# Patient Record
Sex: Female | Born: 1975 | Hispanic: Yes | Marital: Married | Smoking: Never smoker
Health system: Southern US, Community
[De-identification: ages and names within clinical notes are randomized; demographics above are authoritative.]

## PROBLEM LIST (undated history)

## (undated) DIAGNOSIS — Q059 Spina bifida, unspecified: Secondary | ICD-10-CM

## (undated) HISTORY — DX: Spina bifida, unspecified: Q05.9

## (undated) HISTORY — PX: APPENDECTOMY: SHX54

## (undated) HISTORY — PX: CHOLECYSTECTOMY: SHX55

---

## 2018-08-02 ENCOUNTER — Encounter: Payer: Self-pay | Admitting: Obstetrics and Gynecology

## 2018-08-02 ENCOUNTER — Other Ambulatory Visit: Payer: Self-pay

## 2018-08-16 ENCOUNTER — Encounter: Payer: Self-pay | Admitting: Obstetrics and Gynecology

## 2018-11-08 ENCOUNTER — Encounter: Payer: Self-pay | Admitting: Emergency Medicine

## 2018-11-08 ENCOUNTER — Other Ambulatory Visit: Payer: Self-pay

## 2018-11-08 ENCOUNTER — Emergency Department: Payer: Medicaid Other

## 2018-11-08 ENCOUNTER — Emergency Department
Admission: EM | Admit: 2018-11-08 | Discharge: 2018-11-08 | Disposition: A | Discharge status: Home / Self Care | Payer: Medicaid Other | Attending: Emergency Medicine | Admitting: Emergency Medicine

## 2018-11-08 DIAGNOSIS — R51 Headache: Secondary | ICD-10-CM | POA: Diagnosis present

## 2018-11-08 DIAGNOSIS — G44209 Tension-type headache, unspecified, not intractable: Secondary | ICD-10-CM | POA: Insufficient documentation

## 2018-11-08 MED ORDER — METOCLOPRAMIDE HCL 5 MG/ML IJ SOLN
10.0000 mg | Freq: Once | INTRAMUSCULAR | Status: AC
  Administered 2018-11-08: 10 mg via INTRAVENOUS
  Filled 2018-11-08: qty 2

## 2018-11-08 MED ORDER — DIPHENHYDRAMINE HCL 50 MG/ML IJ SOLN
25.0000 mg | Freq: Once | INTRAMUSCULAR | Status: AC
  Administered 2018-11-08: 25 mg via INTRAVENOUS
  Filled 2018-11-08: qty 1

## 2018-11-08 MED ORDER — KETOROLAC TROMETHAMINE 30 MG/ML IJ SOLN
30.0000 mg | Freq: Once | INTRAMUSCULAR | Status: AC
  Administered 2018-11-08: 30 mg via INTRAVENOUS
  Filled 2018-11-08: qty 1

## 2018-11-08 MED ORDER — SODIUM CHLORIDE 0.9 % IV BOLUS
500.0000 mL | Freq: Once | INTRAVENOUS | Status: AC
  Administered 2018-11-08: 13:00:00 500 mL via INTRAVENOUS

## 2018-11-08 NOTE — ED Provider Notes (Signed)
[facility] Emergency Department Provider Note   ____________________________________________   First MD Initiated Contact with Patient 11/08/18 1210     (approximate)  I have reviewed the triage vital signs and the nursing notes.   HISTORY  Chief Complaint Headache Spanish interpreter present.  HPI Gabrielle Gutierrez is a 43 y.o. female presents to the ED with complaint of posterior headache radiating around to the frontal area since 8:30 PM yesterday.  Patient states that the headache started after having intercourse.  She states that she felt "dizzy and bad".  She has a history of a head injury years ago and states "they did not find any bleeding".  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  She has taken ibuprofen once for her headache without any relief.  She denies any visual changes, nausea or vomiting.  No photophobia.  She rates her pain as a 10/10.         History reviewed. No pertinent past medical history.  There are no active problems to display for this patient.   Past Surgical History:  Procedure Laterality Date  . APPENDECTOMY    . CESAREAN SECTION     2  . CHOLECYSTECTOMY      Prior to Admission medications   Not on File    Allergies Patient has no known allergies.  No family history on file.  Social History Social History   Tobacco Use  . Smoking status: Never Smoker  . Smokeless tobacco: Never Used  Substance Use Topics  . Alcohol use: Not Currently  . Drug use: Not on file    Review of Systems Constitutional: No fever/chills Eyes: No visual changes. ENT: No sore throat.  Negative for ear pain. Cardiovascular: Denies chest pain. Respiratory: Denies shortness of breath. Gastrointestinal: No abdominal pain.  No nausea, no vomiting.   Genitourinary: Negative for dysuria. Musculoskeletal: Negative for back pain. Skin: Negative for rash. Neurological: Positive for headaches, negative for  focal weakness or numbness. ____________________________________________   PHYSICAL EXAM:  VITAL SIGNS: ED Triage Vitals  Enc Vitals Group     BP 11/08/18 1157 122/79     Pulse Rate 11/08/18 1157 70     Resp 11/08/18 1157 16     Temp 11/08/18 1201 98.6 F (37 C)     Temp Source 11/08/18 1201 Oral     SpO2 11/08/18 1157 96 %     Weight 11/08/18 1041 200 lb (90.7 kg)     Height 11/08/18 1041 5\' 9"  (1.753 m)     Head Circumference --      Peak Flow --      Pain Score 11/08/18 1036 10     Pain Loc --      Pain Edu? --      Excl. in [place]? --    Constitutional: Alert and oriented. Well appearing and in no acute distress. Eyes: Conjunctivae are normal. PERRL. EOMI. Head: Atraumatic. Nose: No congestion/rhinnorhea. Mouth/Throat: Mucous membranes are moist.  Oropharynx non-erythematous. Neck: No stridor.   Cardiovascular: Normal rate, regular rhythm. Grossly normal heart sounds.  Good peripheral circulation. Respiratory: Normal respiratory effort.  No retractions. Lungs CTAB. Musculoskeletal: Moves upper and lower extremities with any difficulty.  Good muscle strength bilaterally. Neurologic:  Normal speech and language. No gross focal neurologic deficits are appreciated.  Cranial nerves II through XII grossly intact.  No gait instability. Skin:  Skin is warm, dry and intact. No rash noted. Psychiatric: Mood and affect are  normal. Speech and behavior are normal.  ____________________________________________   LABS (all labs ordered are listed, but only abnormal results are displayed)  Labs Reviewed - No data to display  RADIOLOGY  Official radiology report(s): Ct Head Wo Contrast  Result Date: 11/08/2018 CLINICAL DATA:  Severe headache since last night with dizziness. Head injury 9 years ago. EXAM: CT HEAD WITHOUT CONTRAST TECHNIQUE: Contiguous axial images were obtained from the base of the skull through the vertex without intravenous contrast. COMPARISON:  None. FINDINGS:  Brain: Normal appearing cerebral hemispheres and posterior fossa structures. Normal size and position of the ventricles. No intracranial hemorrhage, mass lesion or CT evidence of acute infarction. Vascular: No hyperdense vessel or unexpected calcification. Skull: Normal. Negative for fracture or focal lesion. Sinuses/Orbits: Unremarkable. Other: None. IMPRESSION: Normal examination. Electronically Signed   By: Claudie Revering M.D.   On: 11/08/2018 11:26    ____________________________________________   PROCEDURES  Procedure(s) performed (including Critical Care):  Procedures   ____________________________________________   INITIAL IMPRESSION / ASSESSMENT AND PLAN / ED COURSE  As part of my medical decision making, I reviewed the following data within the electronic MEDICAL RECORD NUMBER Notes from prior ED visits and [place] Controlled Substance Database    ----------------------------------------- 1:17 PM on 11/08/2018 ----------------------------------------- Headache is greatly improved.  43 year old female presents to the ED with complaint of headache that started yesterday after having intercourse.  Patient experienced some dizziness and feeling bad.  Patient has taken ibuprofen once without any relief.  She has a history of a minor head injury that was many years ago.  Physical exam was consistent with a muscle contraction headache with tenderness in the cervical muscles and scalp.  CT scan was negative and she was reassured.  After normal saline, Reglan, Benadryl and Toradol IV patient had no further headache and was discharged.  She is instructed to return to the ED if any worsening of her symptoms and to follow-up with her primary care.    ____________________________________________   FINAL CLINICAL IMPRESSION(S) / ED DIAGNOSES  Final diagnoses:  Acute non intractable tension-type headache     ED Discharge Orders    None       Note:  This document was prepared using Dragon  voice recognition software and may include unintentional dictation errors.    Johnn Hai, PA-C 11/08/18 1447    Harvest Dark, MD 11/09/18 0730

## 2018-11-08 NOTE — ED Triage Notes (Addendum)
Pain in head--from back of neck and over top since 830 pm yesterday while having intercourse.  saays dizziness and feels bad.  Says she has history of head injury, but they didn ot find any bleeding in brain at the time.  She has had some memory loss problems since then, but has not followed up.
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
## 2018-11-08 NOTE — ED Notes (Addendum)
Per interpreter, Pt reports headache from back of neck that radiates up head and to front of head. Reports started last night during intercourse. Reports some dizziness last night, no symptoms today besides the pain.

## 2018-11-08 NOTE — Discharge Instructions (Signed)
Follow-up with your primary care provider if any continued problems.  Return to the emergency department if any worsening of your symptoms or urgent concerns.

## 2018-11-26 ENCOUNTER — Ambulatory Visit: Payer: Medicaid Other | Attending: Family Medicine

## 2018-11-26 ENCOUNTER — Other Ambulatory Visit: Payer: Self-pay

## 2018-11-26 ENCOUNTER — Encounter: Payer: Self-pay | Admitting: Physical Therapy

## 2018-11-26 VITALS — BP 106/64 | HR 70

## 2018-11-26 DIAGNOSIS — M62830 Muscle spasm of back: Secondary | ICD-10-CM | POA: Insufficient documentation

## 2018-11-26 DIAGNOSIS — G8929 Other chronic pain: Secondary | ICD-10-CM | POA: Diagnosis present

## 2018-11-26 DIAGNOSIS — M5441 Lumbago with sciatica, right side: Secondary | ICD-10-CM | POA: Diagnosis present

## 2018-11-26 DIAGNOSIS — M6281 Muscle weakness (generalized): Secondary | ICD-10-CM | POA: Diagnosis present

## 2018-11-26 DIAGNOSIS — M5442 Lumbago with sciatica, left side: Secondary | ICD-10-CM | POA: Insufficient documentation
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
## 2018-11-26 NOTE — Patient Instructions (Signed)
Access Code: [email or web address]  URL: [email or web address]  Date: 11/26/2018  Prepared by: Lieutenant Diego   Exercises Supine Lower Trunk Rotation - 10 reps - 1 sets - 1x daily - 7x weekly Supine Figure 4 Piriformis Stretch - 3 reps - 1 sets - 30 hold - 1x daily - 7x weekly Standing Lumbar Extension with Counter - 10 reps - 1 sets - 1x daily - 7x weekly

## 2018-12-03 ENCOUNTER — Ambulatory Visit: Payer: Medicaid Other | Admitting: Physical Therapy

## 2018-12-04 DIAGNOSIS — R198 Other specified symptoms and signs involving the digestive system and abdomen: Secondary | ICD-10-CM | POA: Insufficient documentation

## 2018-12-04 DIAGNOSIS — R0989 Other specified symptoms and signs involving the circulatory and respiratory systems: Secondary | ICD-10-CM | POA: Insufficient documentation

## 2018-12-04 DIAGNOSIS — R196 Halitosis: Secondary | ICD-10-CM | POA: Insufficient documentation

## 2018-12-10 ENCOUNTER — Encounter: Payer: Self-pay | Admitting: Physical Therapy

## 2018-12-10 ENCOUNTER — Other Ambulatory Visit: Payer: Self-pay

## 2018-12-10 ENCOUNTER — Ambulatory Visit: Payer: Medicaid Other | Admitting: Physical Therapy

## 2018-12-10 DIAGNOSIS — M5441 Lumbago with sciatica, right side: Secondary | ICD-10-CM

## 2018-12-10 DIAGNOSIS — M5442 Lumbago with sciatica, left side: Secondary | ICD-10-CM | POA: Diagnosis not present

## 2018-12-10 DIAGNOSIS — M62830 Muscle spasm of back: Secondary | ICD-10-CM

## 2018-12-10 DIAGNOSIS — G8929 Other chronic pain: Secondary | ICD-10-CM

## 2018-12-10 DIAGNOSIS — M6281 Muscle weakness (generalized): Secondary | ICD-10-CM
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
## 2018-12-12 ENCOUNTER — Ambulatory Visit: Payer: Medicaid Other | Admitting: Physical Therapy

## 2018-12-16 ENCOUNTER — Other Ambulatory Visit: Payer: Self-pay

## 2018-12-16 ENCOUNTER — Encounter: Payer: Self-pay | Admitting: Physical Therapy

## 2018-12-16 ENCOUNTER — Ambulatory Visit: Payer: Medicaid Other | Admitting: Physical Therapy

## 2018-12-16 DIAGNOSIS — M62830 Muscle spasm of back: Secondary | ICD-10-CM

## 2018-12-16 DIAGNOSIS — G8929 Other chronic pain: Secondary | ICD-10-CM

## 2018-12-16 DIAGNOSIS — M5442 Lumbago with sciatica, left side: Secondary | ICD-10-CM | POA: Diagnosis not present

## 2018-12-16 DIAGNOSIS — M6281 Muscle weakness (generalized): Secondary | ICD-10-CM
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
## 2018-12-17 ENCOUNTER — Telehealth: Payer: Self-pay | Admitting: Physical Therapy

## 2018-12-17 ENCOUNTER — Ambulatory Visit: Payer: Medicaid Other | Admitting: Physical Therapy

## 2018-12-17 NOTE — Telephone Encounter (Signed)
Called referring physician, [name], MD's office to let them know that patient reported a radiograph taken in [place] revealed she has Spina Bifida and I wanted to make sure Dr. [name] knew this. Spoke to office staff who said they took down the message to relay to the provider.   Everlean Alstrom. Graylon Good, PT, DPT 12/17/18, 11:05 AM

## 2018-12-19 ENCOUNTER — Encounter: Payer: Medicaid Other | Admitting: Physical Therapy

## 2018-12-24 ENCOUNTER — Ambulatory Visit: Payer: Medicaid Other | Attending: Family Medicine | Admitting: Physical Therapy

## 2018-12-24 ENCOUNTER — Encounter: Payer: Self-pay | Admitting: Physical Therapy

## 2018-12-24 ENCOUNTER — Other Ambulatory Visit: Payer: Self-pay

## 2018-12-24 DIAGNOSIS — G8929 Other chronic pain: Secondary | ICD-10-CM | POA: Insufficient documentation

## 2018-12-24 DIAGNOSIS — M5441 Lumbago with sciatica, right side: Secondary | ICD-10-CM | POA: Diagnosis present

## 2018-12-24 DIAGNOSIS — M62830 Muscle spasm of back: Secondary | ICD-10-CM | POA: Diagnosis present

## 2018-12-24 DIAGNOSIS — M5442 Lumbago with sciatica, left side: Secondary | ICD-10-CM | POA: Diagnosis present

## 2018-12-24 DIAGNOSIS — M6281 Muscle weakness (generalized): Secondary | ICD-10-CM | POA: Diagnosis present
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
## 2018-12-26 ENCOUNTER — Ambulatory Visit: Payer: Medicaid Other | Admitting: Physical Therapy

## 2018-12-26 ENCOUNTER — Encounter: Payer: Self-pay | Admitting: Physical Therapy

## 2018-12-26 ENCOUNTER — Other Ambulatory Visit: Payer: Self-pay

## 2018-12-26 DIAGNOSIS — M5442 Lumbago with sciatica, left side: Secondary | ICD-10-CM | POA: Diagnosis not present

## 2018-12-26 DIAGNOSIS — G8929 Other chronic pain: Secondary | ICD-10-CM

## 2018-12-26 DIAGNOSIS — M62830 Muscle spasm of back: Secondary | ICD-10-CM

## 2018-12-26 DIAGNOSIS — M6281 Muscle weakness (generalized): Secondary | ICD-10-CM
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
## 2018-12-31 ENCOUNTER — Ambulatory Visit: Payer: Medicaid Other | Admitting: Physical Therapy

## 2018-12-31 ENCOUNTER — Other Ambulatory Visit: Payer: Self-pay

## 2018-12-31 DIAGNOSIS — M62830 Muscle spasm of back: Secondary | ICD-10-CM

## 2018-12-31 DIAGNOSIS — M5442 Lumbago with sciatica, left side: Secondary | ICD-10-CM

## 2018-12-31 DIAGNOSIS — G8929 Other chronic pain: Secondary | ICD-10-CM

## 2018-12-31 DIAGNOSIS — M6281 Muscle weakness (generalized): Secondary | ICD-10-CM
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
## 2019-01-01 ENCOUNTER — Encounter: Payer: Self-pay | Admitting: Physical Therapy

## 2019-01-02 ENCOUNTER — Other Ambulatory Visit: Payer: Self-pay

## 2019-01-02 ENCOUNTER — Ambulatory Visit: Payer: Medicaid Other | Admitting: Physical Therapy

## 2019-01-02 ENCOUNTER — Encounter: Payer: Self-pay | Admitting: Physical Therapy

## 2019-01-02 DIAGNOSIS — M6281 Muscle weakness (generalized): Secondary | ICD-10-CM

## 2019-01-02 DIAGNOSIS — G8929 Other chronic pain: Secondary | ICD-10-CM

## 2019-01-02 DIAGNOSIS — M5442 Lumbago with sciatica, left side: Secondary | ICD-10-CM | POA: Diagnosis not present

## 2019-01-02 DIAGNOSIS — M62830 Muscle spasm of back: Secondary | ICD-10-CM
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
## 2019-01-07 ENCOUNTER — Encounter: Payer: Self-pay | Admitting: Physical Therapy

## 2019-01-07 ENCOUNTER — Ambulatory Visit: Payer: Medicaid Other | Admitting: Physical Therapy

## 2019-01-07 ENCOUNTER — Other Ambulatory Visit: Payer: Self-pay

## 2019-01-07 DIAGNOSIS — G8929 Other chronic pain: Secondary | ICD-10-CM

## 2019-01-07 DIAGNOSIS — M5441 Lumbago with sciatica, right side: Secondary | ICD-10-CM

## 2019-01-07 DIAGNOSIS — M5442 Lumbago with sciatica, left side: Secondary | ICD-10-CM | POA: Diagnosis not present

## 2019-01-07 DIAGNOSIS — M6281 Muscle weakness (generalized): Secondary | ICD-10-CM

## 2019-01-07 DIAGNOSIS — M62830 Muscle spasm of back: Secondary | ICD-10-CM
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
## 2019-01-09 ENCOUNTER — Ambulatory Visit: Payer: Medicaid Other | Admitting: Physical Therapy

## 2019-01-09 ENCOUNTER — Other Ambulatory Visit: Payer: Self-pay

## 2019-01-09 DIAGNOSIS — G8929 Other chronic pain: Secondary | ICD-10-CM

## 2019-01-09 DIAGNOSIS — M62830 Muscle spasm of back: Secondary | ICD-10-CM

## 2019-01-09 DIAGNOSIS — M5442 Lumbago with sciatica, left side: Secondary | ICD-10-CM | POA: Diagnosis not present

## 2019-01-09 DIAGNOSIS — M6281 Muscle weakness (generalized): Secondary | ICD-10-CM

## 2019-01-09 DIAGNOSIS — M5441 Lumbago with sciatica, right side: Secondary | ICD-10-CM
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
## 2019-01-10 ENCOUNTER — Encounter: Payer: Self-pay | Admitting: Physical Therapy

## 2019-01-14 ENCOUNTER — Ambulatory Visit: Payer: Medicaid Other | Admitting: Physical Therapy

## 2019-01-16 ENCOUNTER — Ambulatory Visit: Payer: Medicaid Other | Admitting: Physical Therapy

## 2019-01-16 ENCOUNTER — Telehealth: Payer: Self-pay | Admitting: Physical Therapy

## 2019-01-16 NOTE — Telephone Encounter (Signed)
Attempted to call patient after she did not show up for her appointment today or Tuesday this week. Unable to get through busy signal with two attempts.   Everlean Alstrom. Graylon Good, PT, DPT 01/16/19, 1:42 PM

## 2019-01-20 ENCOUNTER — Ambulatory Visit: Payer: Medicaid Other | Admitting: Physical Therapy

## 2019-01-20 ENCOUNTER — Encounter: Payer: Self-pay | Admitting: Physical Therapy

## 2019-01-20 ENCOUNTER — Other Ambulatory Visit: Payer: Self-pay

## 2019-01-20 DIAGNOSIS — M5442 Lumbago with sciatica, left side: Secondary | ICD-10-CM | POA: Diagnosis not present

## 2019-01-20 DIAGNOSIS — M6281 Muscle weakness (generalized): Secondary | ICD-10-CM

## 2019-01-20 DIAGNOSIS — M62830 Muscle spasm of back: Secondary | ICD-10-CM

## 2019-01-20 DIAGNOSIS — G8929 Other chronic pain: Secondary | ICD-10-CM
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
## 2019-01-22 ENCOUNTER — Telehealth: Payer: Self-pay

## 2019-01-22 NOTE — Telephone Encounter (Signed)
Coronavirus (COVID-19) Are you at risk?  Are you at risk for the Coronavirus (COVID-19)?  To be considered HIGH RISK for Coronavirus (COVID-19), you have to meet the following criteria:  . Traveled to [place], [place], [place], [place] or [place]; or in the [place] to [place], [place], [place], or [place]; and have fever, cough, and shortness of breath within the last 2 weeks of travel OR . Been in close contact with a person diagnosed with COVID-19 within the last 2 weeks and have fever, cough, and shortness of breath . IF YOU DO NOT MEET THESE CRITERIA, YOU ARE CONSIDERED LOW RISK FOR COVID-19.  What to do if you are HIGH RISK for COVID-19?  Marland Kitchen If you are having a medical emergency, call 911. . Seek medical care right away. Before you go to a doctor's office, urgent care or emergency department, call ahead and tell them about your recent travel, contact with someone diagnosed with COVID-19, and your symptoms. You should receive instructions from your physician's office regarding next steps of care.  . When you arrive at healthcare provider, tell the healthcare staff immediately you have returned from visiting [place], [place], [place], [place] or [place]; or traveled in the [place] to [place], [place], [place], or [place]; in the last two weeks or you have been in close contact with a person diagnosed with COVID-19 in the last 2 weeks.   . Tell the health care staff about your symptoms: fever, cough and shortness of breath. . After you have been seen by a medical provider, you will be either: o Tested for (COVID-19) and discharged home on quarantine except to seek medical care if symptoms worsen, and asked to  - Stay home and avoid contact with others until you get your results (4-5 days)  - Avoid travel on public transportation if possible (such as bus, train, or airplane) or o Sent to the Emergency Department by EMS for evaluation, COVID-19 testing, and possible  admission depending on your condition and test results.  What to do if you are LOW RISK for COVID-19?  Reduce your risk of any infection by using the same precautions used for avoiding the common cold or flu:  Marland Kitchen Wash your hands often with soap and warm water for at least 20 seconds.  If soap and water are not readily available, use an alcohol-based hand sanitizer with at least 60% alcohol.  . If coughing or sneezing, cover your mouth and nose by coughing or sneezing into the elbow areas of your shirt or coat, into a tissue or into your sleeve (not your hands). . Avoid shaking hands with others and consider head nods or verbal greetings only. . Avoid touching your eyes, nose, or mouth with unwashed hands.  . Avoid close contact with people who are Ausencio Vaden. . Avoid places or events with large numbers of people in one location, like concerts or sporting events. . Carefully consider travel plans you have or are making. . If you are planning any travel outside or inside the [place], visit the CDC's Travelers' Health webpage for the latest health notices. . If you have some symptoms but not all symptoms, continue to monitor at home and seek medical attention if your symptoms worsen. . If you are having a medical emergency, call 911.  01/22/19 SCREENING NEG SLS ADDITIONAL HEALTHCARE OPTIONS FOR PATIENTS  [facility] / e-Visit: [email or web address]         [facility]: [phone]   Urgent Care: [phone]                   [facility]: [phone]  

## 2019-01-23 ENCOUNTER — Other Ambulatory Visit (HOSPITAL_COMMUNITY)
Admission: RE | Admit: 2019-01-23 | Discharge: 2019-01-23 | Disposition: A | Discharge status: Home / Self Care | Payer: Medicaid Other | Source: Ambulatory Visit | Attending: Obstetrics and Gynecology | Admitting: Obstetrics and Gynecology

## 2019-01-23 ENCOUNTER — Encounter: Payer: Self-pay | Admitting: Obstetrics and Gynecology

## 2019-01-23 ENCOUNTER — Ambulatory Visit: Payer: Medicaid Other | Attending: Family Medicine | Admitting: Physical Therapy

## 2019-01-23 ENCOUNTER — Encounter: Payer: Self-pay | Admitting: Physical Therapy

## 2019-01-23 ENCOUNTER — Ambulatory Visit (INDEPENDENT_AMBULATORY_CARE_PROVIDER_SITE_OTHER): Payer: Medicaid Other | Admitting: Obstetrics and Gynecology

## 2019-01-23 ENCOUNTER — Other Ambulatory Visit: Payer: Self-pay

## 2019-01-23 VITALS — BP 107/72 | HR 78 | Ht 69.0 in | Wt 209.7 lb

## 2019-01-23 DIAGNOSIS — M62830 Muscle spasm of back: Secondary | ICD-10-CM | POA: Diagnosis present

## 2019-01-23 DIAGNOSIS — M5442 Lumbago with sciatica, left side: Secondary | ICD-10-CM | POA: Insufficient documentation

## 2019-01-23 DIAGNOSIS — Z87410 Personal history of cervical dysplasia: Secondary | ICD-10-CM

## 2019-01-23 DIAGNOSIS — Z0000 Encounter for general adult medical examination without abnormal findings: Secondary | ICD-10-CM | POA: Diagnosis not present

## 2019-01-23 DIAGNOSIS — M6281 Muscle weakness (generalized): Secondary | ICD-10-CM | POA: Diagnosis present

## 2019-01-23 DIAGNOSIS — G8929 Other chronic pain: Secondary | ICD-10-CM | POA: Diagnosis present

## 2019-01-23 DIAGNOSIS — M5441 Lumbago with sciatica, right side: Secondary | ICD-10-CM | POA: Insufficient documentation

## 2019-01-23 DIAGNOSIS — Z1239 Encounter for other screening for malignant neoplasm of breast: Secondary | ICD-10-CM

## 2019-01-23 DIAGNOSIS — Z01419 Encounter for gynecological examination (general) (routine) without abnormal findings: Secondary | ICD-10-CM

## 2019-01-23 DIAGNOSIS — N951 Menopausal and female climacteric states: Secondary | ICD-10-CM | POA: Diagnosis not present

## 2019-01-23 NOTE — Addendum Note (Signed)
Addended by: Durwin Glaze on: 01/23/2019 09:10 AM   Modules accepted: Orders
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
## 2019-01-23 NOTE — Progress Notes (Signed)
HPI:      [name] is a 43 y.o. No obstetric history on file. who LMP was No LMP recorded.  Subjective:   She presents today for her annual examination.  Patient states that she began having hot flashes and signs and symptoms of menopause approximately a year and a half ago.  Menstrual periods stopped more than a year ago.  She states that her mother went into early menopause at the age of 40. Of significant note patient has a history of abnormal Pap smear with dysplasia and in [place] had cryo.  She has not had Pap smears since that time. She would like [place] to do her blood work but is not fasting today. She is due for a mammogram.    Hx: The following portions of the patient's history were reviewed and updated as appropriate:             She  has a past medical history of Spina bifida ([place]). She does not have a problem list on file. She  has a past surgical history that includes Appendectomy; Cholecystectomy; and Cesarean section. Her family history includes Cancer in her mother; Rheum arthritis in her father. She  reports that she has never smoked. She has never used smokeless tobacco. She reports previous alcohol use. No history on file for drug. She currently has no medications in their medication list. She has No Known Allergies.       Review of Systems:  Review of Systems  Constitutional: Denied constitutional symptoms, night sweats, recent illness, fatigue, fever, insomnia and weight loss.  Eyes: Denied eye symptoms, eye pain, photophobia, vision change and visual disturbance.  Ears/Nose/Throat/Neck: Denied ear, nose, throat or neck symptoms, hearing loss, nasal discharge, sinus congestion and sore throat.  Cardiovascular: Denied cardiovascular symptoms, arrhythmia, chest pain/pressure, edema, exercise intolerance, orthopnea and palpitations.  Respiratory: Denied pulmonary symptoms, asthma, pleuritic pain, productive sputum, cough, dyspnea and wheezing.   Gastrointestinal: Denied, gastro-esophageal reflux, melena, nausea and vomiting.  Genitourinary: Denied genitourinary symptoms including symptomatic vaginal discharge, pelvic relaxation issues, and urinary complaints.  Musculoskeletal: Denied musculoskeletal symptoms, stiffness, swelling, muscle weakness and myalgia.  Dermatologic: Denied dermatology symptoms, rash and scar.  Neurologic: Denied neurology symptoms, dizziness, headache, neck pain and syncope.  Psychiatric: Denied psychiatric symptoms, anxiety and depression.  Endocrine: See HPI for additional information.   Meds:   No current outpatient medications on file prior to visit.   No current facility-administered medications on file prior to visit.     Objective:     Vitals:   01/23/19 0810  BP: 107/72  Pulse: 78              Physical examination General NAD, Conversant  HEENT Atraumatic; Op clear with mmm.  Normo-cephalic. Pupils reactive. Anicteric sclerae  Thyroid/Neck Smooth without nodularity or enlargement. Normal ROM.  Neck Supple.  Skin No rashes, lesions or ulceration. Normal palpated skin turgor. No nodularity.  Breasts: No masses or discharge.  Symmetric.  No axillary adenopathy.  Lungs: Clear to auscultation.No rales or wheezes. Normal Respiratory effort, no retractions.  Heart: NSR.  No murmurs or rubs appreciated. No periferal edema  Abdomen: Soft.  Non-tender.  No masses.  No HSM. No hernia  Extremities: Moves all appropriately.  Normal ROM for age. No lymphadenopathy.  Neuro: Oriented to PPT.  Normal mood. Normal affect.     Pelvic:   Vulva: Normal appearance.  No lesions.  Vagina: No lesions or abnormalities noted.  Support: Normal pelvic  support.  Urethra No masses tenderness or scarring.  Meatus Normal size without lesions or prolapse.  Cervix:  Obviously status post cryo.   no lesions.  Anus: Normal exam.  No lesions.  Perineum: Normal exam.  No lesions.        Bimanual   Uterus: Normal  size.  Non-tender.  Mobile.  AV.  Adnexae: No masses.  Non-tender to palpation.  Cul-de-sac: Negative for abnormality.      Assessment:    No obstetric history on file. There are no active problems to display for this patient.    1. Symptomatic menopausal or female climacteric states   2. History of cervical dysplasia   3. Well woman exam with routine gynecological exam     Patient likely in premature menopause.   Plan:            1.  Basic Screening Recommendations The basic screening recommendations for asymptomatic women were discussed with the patient during her visit.  The age-appropriate recommendations were discussed with her and the rational for the tests reviewed.  When I am informed by the patient that another primary care physician has previously obtained the age-appropriate tests and they are up-to-date, only outstanding tests are ordered and referrals given as necessary.  Abnormal results of tests will be discussed with her when all of her results are completed. Pap performed-we will base future management on this Pap. Labs ordered when patient fasting Mammogram ordered 2.  Will order [facility] and if patient in menopause will discuss importance of HRT. Orders Orders Placed This Encounter  Procedures  . Follicle stimulating hormone    No orders of the defined types were placed in this encounter.       F/U  Return for We will contact her with any abnormal test results.  Finis Bud, M.D. 01/23/2019 8:46 AM

## 2019-01-28 ENCOUNTER — Other Ambulatory Visit: Payer: Self-pay

## 2019-01-28 ENCOUNTER — Other Ambulatory Visit: Payer: Medicaid Other

## 2019-01-28 ENCOUNTER — Encounter: Payer: Self-pay | Admitting: Physical Therapy

## 2019-01-28 ENCOUNTER — Ambulatory Visit: Payer: Medicaid Other

## 2019-01-28 DIAGNOSIS — M62830 Muscle spasm of back: Secondary | ICD-10-CM

## 2019-01-28 DIAGNOSIS — G8929 Other chronic pain: Secondary | ICD-10-CM

## 2019-01-28 DIAGNOSIS — M5442 Lumbago with sciatica, left side: Secondary | ICD-10-CM | POA: Diagnosis not present

## 2019-01-28 DIAGNOSIS — M6281 Muscle weakness (generalized): Secondary | ICD-10-CM

## 2019-01-28 LAB — CYTOLOGY - PAP
Diagnosis: NEGATIVE
HPV: NOT DETECTED
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
## 2019-01-29 LAB — HEMOGLOBIN A1C
Est. average glucose Bld gHb Est-mCnc: 105 mg/dL
Hgb A1c MFr Bld: 5.3 % (ref 4.8–5.6)

## 2019-01-29 LAB — LIPID PANEL
Chol/HDL Ratio: 5 ratio — ABNORMAL HIGH (ref 0.0–4.4)
Cholesterol, Total: 236 mg/dL — ABNORMAL HIGH (ref 100–199)
HDL: 47 mg/dL (ref >39)
LDL Chol Calc (NIH): 152 mg/dL — ABNORMAL HIGH (ref 0–99)
Triglycerides: 205 mg/dL — ABNORMAL HIGH (ref 0–149)
VLDL Cholesterol Cal: 37 mg/dL (ref 5–40)

## 2019-01-29 LAB — FOLLICLE STIMULATING HORMONE: FSH: 46.9 m[IU]/mL

## 2019-01-29 LAB — TSH: TSH: 3.46 u[IU]/mL (ref 0.450–4.500)

## 2019-01-30 ENCOUNTER — Other Ambulatory Visit: Payer: Self-pay

## 2019-01-30 ENCOUNTER — Ambulatory Visit: Payer: Medicaid Other

## 2019-01-30 DIAGNOSIS — M62830 Muscle spasm of back: Secondary | ICD-10-CM

## 2019-01-30 DIAGNOSIS — M5441 Lumbago with sciatica, right side: Secondary | ICD-10-CM

## 2019-01-30 DIAGNOSIS — M6281 Muscle weakness (generalized): Secondary | ICD-10-CM

## 2019-01-30 DIAGNOSIS — M5442 Lumbago with sciatica, left side: Secondary | ICD-10-CM | POA: Diagnosis not present

## 2019-01-30 DIAGNOSIS — G8929 Other chronic pain: Secondary | ICD-10-CM
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
## 2019-02-07 ENCOUNTER — Other Ambulatory Visit: Payer: Self-pay | Admitting: Family Medicine

## 2019-02-07 ENCOUNTER — Other Ambulatory Visit (HOSPITAL_COMMUNITY): Payer: Self-pay | Admitting: Family Medicine

## 2019-02-07 DIAGNOSIS — G8929 Other chronic pain: Secondary | ICD-10-CM

## 2019-02-07 DIAGNOSIS — M545 Low back pain, unspecified: Secondary | ICD-10-CM

## 2019-02-11 ENCOUNTER — Ambulatory Visit: Payer: Medicaid Other | Admitting: Physical Therapy

## 2019-02-18 ENCOUNTER — Telehealth: Payer: Self-pay | Admitting: Physical Therapy

## 2019-02-18 ENCOUNTER — Ambulatory Visit: Payer: Medicaid Other | Admitting: Physical Therapy

## 2019-02-18 NOTE — Telephone Encounter (Addendum)
Attempted to call patient to make sure she is aware of her scheduled appointments after she no-showed to her last appt, which is the first in the current set presently authorized by her insurance company. Phone was busy and unable to complete call.  Will try again later today.   Everlean Alstrom. Graylon Good, PT, DPT 02/18/19, 9:46 AM  Second attempt unsuccessful for same reason. Updated front office staff and requested help to re-attempt contacting patient over the next few days to make sure she is aware of her scheduled appointments. Patient no-showed for 2nd time in a row today.   Everlean Alstrom. Graylon Good, PT, DPT 02/18/19, 2:17 PM

## 2019-02-20 ENCOUNTER — Ambulatory Visit
Admission: RE | Admit: 2019-02-20 | Discharge: 2019-02-20 | Disposition: A | Discharge status: Home / Self Care | Payer: Medicaid Other | Source: Ambulatory Visit | Attending: Family Medicine | Admitting: Family Medicine

## 2019-02-20 ENCOUNTER — Other Ambulatory Visit: Payer: Self-pay

## 2019-02-20 ENCOUNTER — Ambulatory Visit: Payer: Medicaid Other | Attending: Family Medicine | Admitting: Physical Therapy

## 2019-02-20 ENCOUNTER — Encounter: Payer: Self-pay | Admitting: Physical Therapy

## 2019-02-20 DIAGNOSIS — G8929 Other chronic pain: Secondary | ICD-10-CM

## 2019-02-20 DIAGNOSIS — M5442 Lumbago with sciatica, left side: Secondary | ICD-10-CM | POA: Insufficient documentation

## 2019-02-20 DIAGNOSIS — M5441 Lumbago with sciatica, right side: Secondary | ICD-10-CM | POA: Diagnosis present

## 2019-02-20 DIAGNOSIS — M62830 Muscle spasm of back: Secondary | ICD-10-CM | POA: Diagnosis present

## 2019-02-20 DIAGNOSIS — M6281 Muscle weakness (generalized): Secondary | ICD-10-CM | POA: Diagnosis present
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
## 2019-02-24 ENCOUNTER — Encounter: Payer: Self-pay | Admitting: Physical Therapy

## 2019-02-24 ENCOUNTER — Other Ambulatory Visit: Payer: Self-pay

## 2019-02-24 ENCOUNTER — Ambulatory Visit: Payer: Medicaid Other | Admitting: Physical Therapy

## 2019-02-24 DIAGNOSIS — M5442 Lumbago with sciatica, left side: Secondary | ICD-10-CM | POA: Diagnosis not present

## 2019-02-24 DIAGNOSIS — M6281 Muscle weakness (generalized): Secondary | ICD-10-CM

## 2019-02-24 DIAGNOSIS — G8929 Other chronic pain: Secondary | ICD-10-CM

## 2019-02-24 DIAGNOSIS — M62830 Muscle spasm of back: Secondary | ICD-10-CM
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
## 2019-02-26 ENCOUNTER — Ambulatory Visit (INDEPENDENT_AMBULATORY_CARE_PROVIDER_SITE_OTHER): Payer: Medicaid Other | Admitting: Obstetrics and Gynecology

## 2019-02-26 ENCOUNTER — Encounter: Payer: Self-pay | Admitting: Obstetrics and Gynecology

## 2019-02-26 ENCOUNTER — Other Ambulatory Visit: Payer: Self-pay

## 2019-02-26 VITALS — BP 120/77 | HR 81 | Ht 69.0 in | Wt 211.9 lb

## 2019-02-26 DIAGNOSIS — N951 Menopausal and female climacteric states: Secondary | ICD-10-CM

## 2019-02-26 DIAGNOSIS — Z30011 Encounter for initial prescription of contraceptive pills: Secondary | ICD-10-CM | POA: Diagnosis not present

## 2019-02-26 DIAGNOSIS — E7800 Pure hypercholesterolemia, unspecified: Secondary | ICD-10-CM

## 2019-02-26 MED ORDER — LEVONORGEST-ETH ESTRAD 91-DAY 0.15-0.03 &0.01 MG PO TABS: 1.0000 | ORAL_TABLET | Freq: Every day | ORAL | 1 refills | Status: DC

## 2019-02-26 NOTE — Progress Notes (Signed)
Patient comes in today to discuss labs.  

## 2019-02-26 NOTE — Progress Notes (Signed)
HPI:      Gabrielle Gutierrez is a 43 y.o. No obstetric history on file. who LMP was No LMP recorded.  Subjective:   She presents today to discuss her lab findings at her annual examination. She was found to have elevated cholesterol She still has not had a menstrual period in several years and complains of hot flashes. Of significant note patient's mother has a history of breast cancer    Hx: The following portions of the patient's history were reviewed and updated as appropriate:             She  has a past medical history of Spina bifida ([place]). She does not have a problem list on file. She  has a past surgical history that includes Appendectomy; Cholecystectomy; and Cesarean section. Her family history includes Cancer in her mother; Rheum arthritis in her father. She  reports that she has never smoked. She has never used smokeless tobacco. She reports previous alcohol use. No history on file for drug. She currently has no medications in their medication list. She has No Known Allergies.       Review of Systems:  Review of Systems  Constitutional: Denied constitutional symptoms, night sweats, recent illness, fatigue, fever, insomnia and weight loss.  Eyes: Denied eye symptoms, eye pain, photophobia, vision change and visual disturbance.  Ears/Nose/Throat/Neck: Denied ear, nose, throat or neck symptoms, hearing loss, nasal discharge, sinus congestion and sore throat.  Cardiovascular: Denied cardiovascular symptoms, arrhythmia, chest pain/pressure, edema, exercise intolerance, orthopnea and palpitations.  Respiratory: Denied pulmonary symptoms, asthma, pleuritic pain, productive sputum, cough, dyspnea and wheezing.  Gastrointestinal: Denied, gastro-esophageal reflux, melena, nausea and vomiting.  Genitourinary: Denied genitourinary symptoms including symptomatic vaginal discharge, pelvic relaxation issues, and urinary complaints.  Musculoskeletal: Denied musculoskeletal symptoms,  stiffness, swelling, muscle weakness and myalgia.  Dermatologic: Denied dermatology symptoms, rash and scar.  Neurologic: Denied neurology symptoms, dizziness, headache, neck pain and syncope.  Psychiatric: Denied psychiatric symptoms, anxiety and depression.  Endocrine: See HPI for additional information.   Meds:   No current outpatient medications on file prior to visit.   No current facility-administered medications on file prior to visit.     Objective:     Vitals:   02/26/19 1103  BP: 120/77  Pulse: 81                Assessment:    No obstetric history on file. There are no active problems to display for this patient.    1. Symptomatic menopausal or female climacteric states   2. Hypercholesterolemia     FSH is consistent with early menopause.   Plan:            1.  Discussed elevated cholesterol and diet.  Recommendations given handout given on low-cholesterol diet.  2.  Discussed early menopause and HRT in detail.  Risks and benefits reviewed.  All questions answered.  Will start OCPs for early menopause.  Patient would like to take them in a continuous manner. Orders No orders of the defined types were placed in this encounter.   No orders of the defined types were placed in this encounter.     F/U  Return in about 4 months (around 06/29/2019). I spent 22 minutes involved in the care of this patient of which greater than 50% was spent discussing early menopause, use of HRT, risks and benefits of HRT, elevated cholesterol, low-cholesterol diet, healthy diet and exercise.  All questions answered.  Finis Bud, M.D.  02/26/2019 11:52 AM

## 2019-02-27 ENCOUNTER — Ambulatory Visit: Payer: Medicaid Other | Admitting: Physical Therapy

## 2019-02-27 ENCOUNTER — Encounter: Payer: Self-pay | Admitting: Physical Therapy

## 2019-02-27 DIAGNOSIS — M6281 Muscle weakness (generalized): Secondary | ICD-10-CM

## 2019-02-27 DIAGNOSIS — M5442 Lumbago with sciatica, left side: Secondary | ICD-10-CM | POA: Diagnosis not present

## 2019-02-27 DIAGNOSIS — M62830 Muscle spasm of back: Secondary | ICD-10-CM

## 2019-02-27 DIAGNOSIS — G8929 Other chronic pain: Secondary | ICD-10-CM
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
## 2019-02-28 ENCOUNTER — Encounter: Payer: Self-pay | Admitting: Physical Therapy

## 2019-03-03 ENCOUNTER — Other Ambulatory Visit: Payer: Self-pay

## 2019-03-03 ENCOUNTER — Encounter: Payer: Self-pay | Admitting: Physical Therapy

## 2019-03-03 ENCOUNTER — Ambulatory Visit: Payer: Medicaid Other | Admitting: Physical Therapy

## 2019-03-03 DIAGNOSIS — M62830 Muscle spasm of back: Secondary | ICD-10-CM

## 2019-03-03 DIAGNOSIS — M6281 Muscle weakness (generalized): Secondary | ICD-10-CM

## 2019-03-03 DIAGNOSIS — G8929 Other chronic pain: Secondary | ICD-10-CM

## 2019-03-03 DIAGNOSIS — M5442 Lumbago with sciatica, left side: Secondary | ICD-10-CM | POA: Diagnosis not present
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
## 2019-03-06 ENCOUNTER — Ambulatory Visit: Payer: Medicaid Other | Admitting: Physical Therapy

## 2019-03-06 ENCOUNTER — Encounter: Payer: Self-pay | Admitting: Physical Therapy

## 2019-03-06 ENCOUNTER — Other Ambulatory Visit: Payer: Self-pay

## 2019-03-06 DIAGNOSIS — M5442 Lumbago with sciatica, left side: Secondary | ICD-10-CM | POA: Diagnosis not present

## 2019-03-06 DIAGNOSIS — G8929 Other chronic pain: Secondary | ICD-10-CM

## 2019-03-06 DIAGNOSIS — M6281 Muscle weakness (generalized): Secondary | ICD-10-CM

## 2019-03-06 DIAGNOSIS — M62830 Muscle spasm of back: Secondary | ICD-10-CM
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
## 2019-03-10 ENCOUNTER — Ambulatory Visit: Payer: Medicaid Other | Admitting: Physical Therapy

## 2019-03-13 ENCOUNTER — Other Ambulatory Visit: Payer: Self-pay

## 2019-03-13 ENCOUNTER — Encounter: Payer: Self-pay | Admitting: Physical Therapy

## 2019-03-13 ENCOUNTER — Ambulatory Visit: Payer: Medicaid Other | Admitting: Physical Therapy

## 2019-03-13 DIAGNOSIS — G8929 Other chronic pain: Secondary | ICD-10-CM

## 2019-03-13 DIAGNOSIS — M5442 Lumbago with sciatica, left side: Secondary | ICD-10-CM

## 2019-03-13 DIAGNOSIS — M62830 Muscle spasm of back: Secondary | ICD-10-CM

## 2019-03-13 DIAGNOSIS — M6281 Muscle weakness (generalized): Secondary | ICD-10-CM
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
## 2019-03-14 ENCOUNTER — Ambulatory Visit
Admission: RE | Admit: 2019-03-14 | Discharge: 2019-03-14 | Disposition: A | Discharge status: Home / Self Care | Payer: Medicaid Other | Source: Ambulatory Visit | Attending: Obstetrics and Gynecology | Admitting: Obstetrics and Gynecology

## 2019-03-14 DIAGNOSIS — Z1231 Encounter for screening mammogram for malignant neoplasm of breast: Secondary | ICD-10-CM | POA: Diagnosis not present

## 2019-03-14 DIAGNOSIS — Z1239 Encounter for other screening for malignant neoplasm of breast: Secondary | ICD-10-CM

## 2019-03-17 ENCOUNTER — Encounter: Payer: Self-pay | Admitting: Emergency Medicine

## 2019-03-17 ENCOUNTER — Emergency Department
Admission: EM | Admit: 2019-03-17 | Discharge: 2019-03-17 | Disposition: A | Discharge status: Home / Self Care | Payer: Medicaid Other | Attending: Emergency Medicine | Admitting: Emergency Medicine

## 2019-03-17 ENCOUNTER — Encounter: Payer: Medicaid Other | Admitting: Physical Therapy

## 2019-03-17 DIAGNOSIS — R519 Headache, unspecified: Secondary | ICD-10-CM | POA: Diagnosis not present

## 2019-03-17 DIAGNOSIS — Z5321 Procedure and treatment not carried out due to patient leaving prior to being seen by health care provider: Secondary | ICD-10-CM | POA: Insufficient documentation
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
## 2019-03-20 ENCOUNTER — Encounter: Payer: Medicaid Other | Admitting: Physical Therapy

## 2019-04-16 ENCOUNTER — Encounter: Payer: Medicaid Other | Admitting: Obstetrics and Gynecology

## 2019-04-16 ENCOUNTER — Other Ambulatory Visit: Payer: Self-pay

## 2019-05-01 ENCOUNTER — Encounter: Payer: Self-pay | Admitting: Obstetrics and Gynecology

## 2019-05-01 ENCOUNTER — Other Ambulatory Visit: Payer: Self-pay

## 2019-05-01 ENCOUNTER — Ambulatory Visit (INDEPENDENT_AMBULATORY_CARE_PROVIDER_SITE_OTHER): Payer: Medicaid Other | Admitting: Obstetrics and Gynecology

## 2019-05-01 VITALS — BP 105/69 | HR 76 | Ht 69.0 in | Wt 212.8 lb

## 2019-05-01 DIAGNOSIS — N951 Menopausal and female climacteric states: Secondary | ICD-10-CM

## 2019-05-01 DIAGNOSIS — Z20822 Contact with and (suspected) exposure to covid-19: Secondary | ICD-10-CM

## 2019-05-01 NOTE — Progress Notes (Signed)
HPI:      [name] is a 43 y.o. No obstetric history on file. who LMP was No LMP recorded. Patient is perimenopausal.  Subjective:   She presents today to again discuss hormone replacement for premature menopause.  She continues to have hot flashes and mood changes consistent with menopause.  She has previously been tested and found to be in early menopause. At her last visit we prescribed OCPs for hormone replacement but as soon as she began taking them her mother informed her that she should stop and not take any hormones.  (Mother has a history of breast cancer-patient thinks mother and sister have been tested for the BRCA gene and found to be negative but she is not completely sure) She is here today to make a further decision regarding use of OCPs breast cancer hormone replacement therapy and early menopause. A interpreter is present to them interpret.    Hx: The following portions of the patient's history were reviewed and updated as appropriate:             She  has a past medical history of Spina bifida ([place]). She does not have a problem list on file. She  has a past surgical history that includes Appendectomy; Cholecystectomy; and Cesarean section. Her family history includes Breast cancer in her mother; Cancer in her mother; Rheum arthritis in her father. She  reports that she has never smoked. She has never used smokeless tobacco. She reports previous alcohol use. No history on file for drug. She has a current medication list which includes the following prescription(s): levonorgestrel-ethinyl estradiol. She has No Known Allergies.       Review of Systems:  Review of Systems  Constitutional: Denied constitutional symptoms, night sweats, recent illness, fatigue, fever, insomnia and weight loss.  Eyes: Denied eye symptoms, eye pain, photophobia, vision change and visual disturbance.  Ears/Nose/Throat/Neck: Denied ear, nose, throat or neck symptoms, hearing loss,  nasal discharge, sinus congestion and sore throat.  Cardiovascular: Denied cardiovascular symptoms, arrhythmia, chest pain/pressure, edema, exercise intolerance, orthopnea and palpitations.  Respiratory: Denied pulmonary symptoms, asthma, pleuritic pain, productive sputum, cough, dyspnea and wheezing.  Gastrointestinal: Denied, gastro-esophageal reflux, melena, nausea and vomiting.  Genitourinary: Denied genitourinary symptoms including symptomatic vaginal discharge, pelvic relaxation issues, and urinary complaints.  Musculoskeletal: Denied musculoskeletal symptoms, stiffness, swelling, muscle weakness and myalgia.  Dermatologic: Denied dermatology symptoms, rash and scar.  Neurologic: Denied neurology symptoms, dizziness, headache, neck pain and syncope.  Psychiatric: Denied psychiatric symptoms, anxiety and depression.  Endocrine: Denied endocrine symptoms including hot flashes and night sweats.   Meds:   Current Outpatient Medications on File Prior to Visit  Medication Sig Dispense Refill  . Levonorgestrel-Ethinyl Estradiol (AMETHIA) 0.15-0.03 &0.01 MG tablet Take 1 tablet by mouth at bedtime. Skip the last week of each pack and begin the next week without interruption (Patient not taking: Reported on 05/01/2019) 84 tablet 1   No current facility-administered medications on file prior to visit.    Objective:     Vitals:   05/01/19 0927  BP: 105/69  Pulse: 76                Assessment:    No obstetric history on file. There are no problems to display for this patient.    1. Symptomatic menopausal or female climacteric states     Patient unsure about use of hormones and family history of breast cancer   Plan:  1.  We discussed breast cancer hormones in detail.  Specifically use of OCPs and breast cancer.  I have reassured the patient that her risk of other issues are far greater than any breast cancer risk.  We have discussed the possible family history of  breast cancer and the testing for BRCA 1/BRCA2 genes.  She believes her mother and sister have had this test and will check further.  I have offered her genetic testing and she will inform me once she has discussed this with her mother and sister. She now plans to restart OCPs. (She is taking a generic form of Seasonique and skipping the last week so that she does not have a menstrual period or hot flashes during the off week) Orders No orders of the defined types were placed in this encounter.   No orders of the defined types were placed in this encounter.     F/U  No follow-ups on file. I spent 28 minutes involved in the care of this patient of which greater than 50% was spent discussing risks and benefits of OCPs in light of family history of breast cancer, BRCA1 cA2 genes, inheritable versus noninheritable cancers, health concerns regarding premature menopause especially when patient declines hormone use.  All questions answered.  Finis Bud, M.D. 05/01/2019 10:12 AM

## 2019-05-03 LAB — NOVEL CORONAVIRUS, NAA: SARS-CoV-2, NAA: NOT DETECTED

## 2019-05-04 ENCOUNTER — Telehealth: Payer: Self-pay

## 2019-05-04 NOTE — Telephone Encounter (Signed)
Received call from patient checking Covid results.  Advised results negative.   

## 2019-05-12 ENCOUNTER — Emergency Department: Payer: Medicaid Other

## 2019-05-12 ENCOUNTER — Emergency Department
Admission: EM | Admit: 2019-05-12 | Discharge: 2019-05-12 | Disposition: A | Discharge status: Home / Self Care | Payer: Medicaid Other | Attending: Emergency Medicine | Admitting: Emergency Medicine

## 2019-05-12 ENCOUNTER — Other Ambulatory Visit: Payer: Self-pay

## 2019-05-12 DIAGNOSIS — U071 COVID-19: Secondary | ICD-10-CM | POA: Diagnosis present

## 2019-05-12 LAB — CBC
HCT: 42.6 % (ref 36.0–46.0)
Hemoglobin: 14 g/dL (ref 12.0–15.0)
MCH: 28.7 pg (ref 26.0–34.0)
MCHC: 32.9 g/dL (ref 30.0–36.0)
MCV: 87.3 fL (ref 80.0–100.0)
Platelets: 280 10*3/uL (ref 150–400)
RBC: 4.88 MIL/uL (ref 3.87–5.11)
RDW: 13.4 % (ref 11.5–15.5)
WBC: 4.8 10*3/uL (ref 4.0–10.5)
nRBC: 0 % (ref 0.0–0.2)

## 2019-05-12 LAB — BASIC METABOLIC PANEL
Anion gap: 7 (ref 5–15)
BUN: 9 mg/dL (ref 6–20)
CO2: 24 mmol/L (ref 22–32)
Calcium: 8.9 mg/dL (ref 8.9–10.3)
Chloride: 106 mmol/L (ref 98–111)
Creatinine, Ser: 0.52 mg/dL (ref 0.44–1.00)
GFR calc Af Amer: >60 mL/min (ref >60)
GFR calc non Af Amer: >60 mL/min (ref >60)
Glucose, Bld: 84 mg/dL (ref 70–99)
Potassium: 3.8 mmol/L (ref 3.5–5.1)
Sodium: 137 mmol/L (ref 135–145)

## 2019-05-12 MED ORDER — PREDNISONE 20 MG PO TABS: 40.0000 mg | ORAL_TABLET | Freq: Every day | ORAL | 0 refills | Status: DC

## 2019-05-12 NOTE — ED Provider Notes (Signed)
[facility] Emergency Department Provider Note  Time seen: 2:53 PM  I have reviewed the triage vital signs and the nursing notes.   HISTORY  Chief Complaint Shortness of Breath and COVID   HPI Gabrielle Gutierrez is a 43 y.o. female with no significant past medical history presents to the emergency department for shortness of breath and continued sickness.  According to the patient she was diagnosed with COVID-19 on the 16th, symptoms started 14 or 15th.  States she has had a mild cough she has had congestion in her throat and ears and some mild shortness of breath.  Patient states she has a pulse oximeter at home and her pulse oximeter has read between 95 and 97% at home.  States however today she stood up and went to the bathroom and look like her lips were pale when she looked herself in the mirror, she became concerned so she came to the emergency department for evaluation.  Here the patient appears well denies any symptoms at this time denies any shortness of breath.   Past Medical History:  Diagnosis Date  . Spina bifida ([place])     There are no problems to display for this patient.   Past Surgical History:  Procedure Laterality Date  . APPENDECTOMY    . CESAREAN SECTION     2  . CHOLECYSTECTOMY      Prior to Admission medications   Not on File    No Known Allergies  Family History  Problem Relation Age of Onset  . Cancer Mother   . Breast cancer Mother   . Rheum arthritis Father     Social History Social History   Tobacco Use  . Smoking status: Never Smoker  . Smokeless tobacco: Never Used  Substance Use Topics  . Alcohol use: Not Currently  . Drug use: Not on file    Review of Systems Constitutional: Intermittent low-grade fever. ENT: Positive for nasal and throat congestion. Cardiovascular: Negative for chest pain. Respiratory: Intermittent mild shortness of breath.  Occasional mild cough. Gastrointestinal: Negative for  abdominal pain, vomiting Musculoskeletal: Negative for musculoskeletal complaints Neurological: Negative for headache All other ROS negative  ____________________________________________   PHYSICAL EXAM:  VITAL SIGNS: ED Triage Vitals [05/12/19 1345]  Enc Vitals Group     BP 131/84     Pulse Rate 82     Resp 18     Temp 98.8 F (37.1 C)     Temp Source Oral     SpO2 98 %     Weight 210 lb (95.3 kg)     Height 5\' 9"  (1.753 m)     Head Circumference      Peak Flow      Pain Score 6     Pain Loc      Pain Edu?      Excl. in Colon?     Constitutional: Alert and oriented. Well appearing and in no distress. Eyes: Normal exam ENT      Head: Normocephalic and atraumatic.      Mouth/Throat: Mucous membranes are moist. Cardiovascular: Normal rate, regular rhythm.  Respiratory: Normal respiratory effort without tachypnea nor retractions. Breath sounds are clear  Gastrointestinal: Soft and nontender. No distention. Musculoskeletal: Nontender with normal range of motion in all extremities.  Neurologic:  Normal speech and language. No gross focal neurologic deficits  Skin:  Skin is warm, dry and intact.  Psychiatric: Mood and affect are normal.   ____________________________________________    EKG  EKG viewed and interpreted by myself shows a normal sinus rhythm at 85 bpm with a narrow QRS, normal axis, normal intervals, no concerning ST changes.  ____________________________________________    RADIOLOGY  IMPRESSION:  No active cardiopulmonary disease.   ____________________________________________   INITIAL IMPRESSION / ASSESSMENT AND PLAN / ED COURSE  Pertinent labs & imaging results that were available during my care of the patient were reviewed by me and considered in my medical decision making (see chart for details).   Patient presents to the emergency department with COVID-19.  Patient has had symptoms for approximately 7 days at this time.  Continues to states  mild shortness of breath at times especially with exertion.  No chest pain.  Occasional cough still has congestion.  Overall the patient appears well 98% room air saturation, reassuring vitals, reassuring lab work as well as a negative chest x-ray and normal EKG.  We will place the patient on a short course of steroids have the patient follow-up with her doctor as needed.  Discussed return precautions with the patient.  Gabrielle Gutierrez was evaluated in Emergency Department on 05/12/2019 for the symptoms described in the history of present illness. She was evaluated in the context of the global COVID-19 pandemic, which necessitated consideration that the patient might be at risk for infection with the SARS-CoV-2 virus that causes COVID-19. Institutional protocols and algorithms that pertain to the evaluation of patients at risk for COVID-19 are in a state of rapid change based on information released by regulatory bodies including the CDC and federal and state organizations. These policies and algorithms were followed during the patient's care in the ED.  ____________________________________________   FINAL CLINICAL IMPRESSION(S) / ED DIAGNOSES  COVID-19   Harvest Dark, MD 05/12/19 1501

## 2019-05-12 NOTE — ED Triage Notes (Signed)
Dx with COVID Wednesday, increased SOB over last 2 days, backache. States pulse ox at home was 96%. Pt alert and oriented X4, cooperative, RR even and unlabored, color WNL. Pt in NAD.  Interpreter used for triage.

## 2019-05-12 NOTE — ED Notes (Signed)
See triage note  Presents with SOB   States the SOB became worse of the past 2 days   Afebrile on arrival

## 2019-07-02 ENCOUNTER — Encounter: Payer: Medicaid Other | Admitting: Obstetrics and Gynecology

## 2019-08-09 ENCOUNTER — Ambulatory Visit: Payer: Medicaid Other

## 2019-08-17 ENCOUNTER — Ambulatory Visit: Payer: Medicaid Other | Attending: Internal Medicine

## 2019-08-17 DIAGNOSIS — Z23 Encounter for immunization: Secondary | ICD-10-CM

## 2019-08-17 NOTE — Progress Notes (Signed)
   Covid-19 Vaccination Clinic  Name:  Gabrielle Gutierrez    MRN: [id number] DOB: June 20, 1975  08/17/2019  [name] was observed post Covid-19 immunization for 15 minutes without incident. She was provided with Vaccine Information Sheet and instruction to access the V-Safe system.   [name] was instructed to call 911 with any severe reactions post vaccine: Marland Kitchen Difficulty breathing  . Swelling of face and throat  . A fast heartbeat  . A bad rash all over body  . Dizziness and weakness   Immunizations Administered    Name Date Dose VIS Date Route   Pfizer COVID-19 Vaccine 08/17/2019  7:01 PM 0.3 mL 05/02/2019 Intramuscular   Manufacturer: [place]   Lot: [id number]   [place]: [id number]

## 2019-08-20 ENCOUNTER — Encounter: Payer: Medicaid Other | Admitting: Obstetrics and Gynecology

## 2019-09-07 ENCOUNTER — Ambulatory Visit: Payer: Medicaid Other | Attending: Internal Medicine

## 2019-09-07 DIAGNOSIS — Z23 Encounter for immunization: Secondary | ICD-10-CM

## 2019-09-07 NOTE — Progress Notes (Signed)
   Covid-19 Vaccination Clinic  Name:  Chamia Wiesner    MRN: [id number] DOB: 02-10-1976  09/07/2019  Ms. Sheniah Demske was observed post Covid-19 immunization for 15 minutes without incident. She was provided with Vaccine Information Sheet and instruction to access the V-Safe system.   Ms. Marielle Klenke was instructed to call 911 with any severe reactions post vaccine: Marland Kitchen Difficulty breathing  . Swelling of face and throat  . A fast heartbeat  . A bad rash all over body  . Dizziness and weakness   Immunizations Administered    Name Date Dose VIS Date Route   Pfizer COVID-19 Vaccine 09/07/2019  6:05 PM 0.3 mL 05/02/2019 Intramuscular   Manufacturer: [place]   Lot: [id number]   [place]: [id number]

## 2020-02-13 ENCOUNTER — Other Ambulatory Visit: Payer: Self-pay | Admitting: Family Medicine

## 2020-02-13 DIAGNOSIS — Z1231 Encounter for screening mammogram for malignant neoplasm of breast: Secondary | ICD-10-CM

## 2020-02-16 ENCOUNTER — Encounter: Payer: Self-pay | Admitting: Emergency Medicine

## 2020-02-16 ENCOUNTER — Other Ambulatory Visit: Payer: Self-pay

## 2020-02-16 ENCOUNTER — Emergency Department: Payer: Medicaid Other

## 2020-02-16 ENCOUNTER — Emergency Department
Admission: EM | Admit: 2020-02-16 | Discharge: 2020-02-16 | Disposition: A | Discharge status: Home / Self Care | Payer: Medicaid Other | Attending: Emergency Medicine | Admitting: Emergency Medicine

## 2020-02-16 DIAGNOSIS — R109 Unspecified abdominal pain: Secondary | ICD-10-CM | POA: Insufficient documentation

## 2020-02-16 LAB — COMPREHENSIVE METABOLIC PANEL
ALT: 30 U/L (ref 0–44)
AST: 21 U/L (ref 15–41)
Albumin: 4.2 g/dL (ref 3.5–5.0)
Alkaline Phosphatase: 67 U/L (ref 38–126)
Anion gap: 8 (ref 5–15)
BUN: 12 mg/dL (ref 6–20)
CO2: 26 mmol/L (ref 22–32)
Calcium: 9.4 mg/dL (ref 8.9–10.3)
Chloride: 103 mmol/L (ref 98–111)
Creatinine, Ser: 0.5 mg/dL (ref 0.44–1.00)
GFR calc Af Amer: >60 mL/min (ref >60)
GFR calc non Af Amer: >60 mL/min (ref >60)
Glucose, Bld: 89 mg/dL (ref 70–99)
Potassium: 4 mmol/L (ref 3.5–5.1)
Sodium: 137 mmol/L (ref 135–145)
Total Bilirubin: 0.8 mg/dL (ref 0.3–1.2)
Total Protein: 7.8 g/dL (ref 6.5–8.1)

## 2020-02-16 LAB — URINALYSIS, COMPLETE (UACMP) WITH MICROSCOPIC
Bilirubin Urine: NEGATIVE
Glucose, UA: NEGATIVE mg/dL
Hgb urine dipstick: NEGATIVE
Ketones, ur: NEGATIVE mg/dL
Leukocytes,Ua: NEGATIVE
Nitrite: NEGATIVE
Protein, ur: NEGATIVE mg/dL
Specific Gravity, Urine: 1.01 (ref 1.005–1.030)
pH: 8 (ref 5.0–8.0)

## 2020-02-16 LAB — CBC
HCT: 42.1 % (ref 36.0–46.0)
Hemoglobin: 13.8 g/dL (ref 12.0–15.0)
MCH: 29.2 pg (ref 26.0–34.0)
MCHC: 32.8 g/dL (ref 30.0–36.0)
MCV: 89.2 fL (ref 80.0–100.0)
Platelets: 305 10*3/uL (ref 150–400)
RBC: 4.72 MIL/uL (ref 3.87–5.11)
RDW: 13.2 % (ref 11.5–15.5)
WBC: 5.3 10*3/uL (ref 4.0–10.5)
nRBC: 0 % (ref 0.0–0.2)

## 2020-02-16 LAB — LIPASE, BLOOD: Lipase: 26 U/L (ref 11–51)

## 2020-02-16 LAB — PREGNANCY, URINE: Preg Test, Ur: NEGATIVE

## 2020-02-16 MED ORDER — GABAPENTIN 100 MG PO CAPS: 100.0000 mg | ORAL_CAPSULE | Freq: Three times a day (TID) | ORAL | 0 refills | Status: DC | PRN

## 2020-02-16 MED ORDER — IBUPROFEN 600 MG PO TABS
600.0000 mg | ORAL_TABLET | ORAL | Status: AC
  Administered 2020-02-16: 600 mg via ORAL
  Filled 2020-02-16: qty 1

## 2020-02-16 NOTE — ED Provider Notes (Signed)
[facility] Emergency Department Provider Note   ____________________________________________   First MD Initiated Contact with Patient 02/16/20 1408     (approximate)  I have reviewed the triage vital signs and the nursing notes.   HISTORY  Chief Complaint Abdominal Pain  Spanish interpreter utilized throughout  HPI Gabrielle Gutierrez is a 44 y.o. female here for evaluation of right flank discomfort  Patient reports about 2 weeks ago started having a prickly sort of lancinating feeling in her right back right flank area.  She saw her provider about 4 5 days ago at [facility] and they have trialed ibuprofen as well as a medication she is taking in the evening.  She has not seen relief.  No nausea or vomiting.  No fevers or chills.  Denies "abdominal pain" rather reports it feels like at discomfort comes ulcer like a band from her back when he coughs or edge of her right lower ribs  Denies pregnancy.  She has been through menopause for about 2 years.  No fevers or chills.  No chest pain or trouble breathing.  Reports that she is not had this before, her doctor told her that if it continued she should probably get evaluated further at the ER  Prior had her gallbladder removed.  Prior had an appendectomy.  2 previous C-sections.     Past Medical History:  Diagnosis Date  . Spina bifida ([place])     There are no problems to display for this patient.   Past Surgical History:  Procedure Laterality Date  . APPENDECTOMY    . CESAREAN SECTION     2  . CHOLECYSTECTOMY      Prior to Admission medications   Medication Sig Start Date End Date Taking? Authorizing Provider  gabapentin (NEURONTIN) 100 MG capsule Take 1 capsule (100 mg total) by mouth 3 (three) times daily as needed (right flank pain). 02/16/20 02/15/21  Delman Kitten, MD  predniSONE (DELTASONE) 20 MG tablet Take 2 tablets (40 mg total) by mouth daily. 05/12/19   Harvest Dark, MD     Allergies Patient has no known allergies.  Family History  Problem Relation Age of Onset  . Cancer Mother   . Breast cancer Mother   . Rheum arthritis Father     Social History Social History   Tobacco Use  . Smoking status: Never Smoker  . Smokeless tobacco: Never Used  Vaping Use  . Vaping Use: Never used  Substance Use Topics  . Alcohol use: Yes  . Drug use: Never    Review of Systems Constitutional: No fever/chills Eyes: No visual changes. ENT: No sore throat. Cardiovascular: Denies chest pain. Respiratory: Denies shortness of breath. Gastrointestinal: No abdominal pain.  Reports other discomfort in her right flank reporting towards her costovertebral angle dry Below the edge of her right rib margin Genitourinary: Negative for dysuria. Musculoskeletal: Negative for back pain. Skin: Negative for rash. Neurological: Negative for headaches, areas of focal weakness or numbness.    ____________________________________________   PHYSICAL EXAM:  VITAL SIGNS: ED Triage Vitals  Enc Vitals Group     BP 02/16/20 1208 108/75     Pulse Rate 02/16/20 1208 79     Resp 02/16/20 1208 16     Temp 02/16/20 1208 99.4 F (37.4 C)     Temp Source 02/16/20 1208 Oral     SpO2 02/16/20 1208 96 %     Weight 02/16/20 1209 218 lb (98.9 kg)     Height  02/16/20 1209 5\' 9"  (1.753 m)     Head Circumference --      Peak Flow --      Pain Score 02/16/20 1209 9     Pain Loc --      Pain Edu? --      Excl. in [place]? --     Constitutional: Alert and oriented. Well appearing and in no acute distress. Eyes: Conjunctivae are normal. Head: Atraumatic. Nose: No congestion/rhinnorhea. Mouth/Throat: Mucous membranes are moist. Neck: No stridor.  Cardiovascular: Normal rate, regular rhythm. Grossly normal heart sounds.  Good peripheral circulation. Respiratory: Normal respiratory effort.  No retractions. Lungs CTAB. Gastrointestinal: Soft and nontender. No distention.  No pain or  discomfort to palpation.  She does report a little bit of discomfort along the right lower thoracic paraspinous region but nothing midline.  She reports it feels like a lancinating type of discomfort that comes out.  Told by her doctor it might be "nerve pain" Musculoskeletal: No lower extremity tenderness nor edema. Neurologic:  Normal speech and language. No gross focal neurologic deficits are appreciated.  Skin:  Skin is warm, dry and intact. No rash noted. Psychiatric: Mood and affect are normal. Speech and behavior are normal.  ____________________________________________   LABS (all labs ordered are listed, but only abnormal results are displayed)  Labs Reviewed  URINALYSIS, COMPLETE (UACMP) WITH MICROSCOPIC - Abnormal; Notable for the following components:      Result Value   Color, Urine YELLOW (*)    APPearance CLEAR (*)    Bacteria, UA RARE (*)    All other components within normal limits  LIPASE, BLOOD  COMPREHENSIVE METABOLIC PANEL  CBC  PREGNANCY, URINE  POC URINE PREG, ED   ____________________________________________  EKG   ____________________________________________  RADIOLOGY  DG Chest 2 View  Result Date: 02/16/2020 CLINICAL DATA:  Flank pain right upper quadrant and right flank. Additional history provided: History of appendectomy, cholecystectomy. EXAM: CHEST - 2 VIEW COMPARISON:  Chest radiographs 05/12/2019. FINDINGS: Heart size within normal limits. No appreciable airspace consolidation or pulmonary edema. No evidence of pleural effusion or pneumothorax. No acute bony abnormality identified. IMPRESSION: No evidence of active cardiopulmonary disease. Electronically Signed   By: Kellie Simmering DO   On: 02/16/2020 14:56   US Renal  Result Date: 02/16/2020 CLINICAL DATA:  Acute right flank pain. EXAM: RENAL / URINARY TRACT ULTRASOUND COMPLETE COMPARISON:  None. FINDINGS: Right Kidney: Renal measurements: 11.8 x 5.5 x 5.5 cm = volume: 187 mL. Echogenicity  within normal limits. No mass or hydronephrosis visualized. Left Kidney: Renal measurements: 12.7 x 6.5 x 4.9 cm = volume: 212 mL. Echogenicity within normal limits. No mass or hydronephrosis visualized. Bladder: Appears normal for degree of bladder distention. Other: None. IMPRESSION: Normal renal ultrasound. Electronically Signed   By: Marijo Conception M.D.   On: 02/16/2020 15:22   US ABDOMEN LIMITED RUQ  Result Date: 02/16/2020 CLINICAL DATA:  Right upper quadrant pain. EXAM: ULTRASOUND ABDOMEN LIMITED RIGHT UPPER QUADRANT COMPARISON:  None. FINDINGS: Gallbladder: Surgically absent Common bile duct: Diameter: 2.4 mm Liver: Increased echogenicity liver diffusely. No focal liver lesion. Portal vein is patent on color Doppler imaging with normal direction of blood flow towards the liver. Other: None. IMPRESSION: Postop cholecystectomy.  No biliary dilatation Echogenic liver consistent with fatty liver. Electronically Signed   By: Franchot Gallo M.D.   On: 02/16/2020 15:25   Reassuring imaging, ultrasound is reviewed. No evidence of acute. Fatty liver is noted, discussed with the patient  recommendation for follow-up and weight loss regarding this. Patient understanding ____________________________________________   PROCEDURES  Procedure(s) performed: None  Procedures  Critical Care performed: No  ____________________________________________   INITIAL IMPRESSION / ASSESSMENT AND PLAN / ED COURSE  Pertinent labs & imaging results that were available during my care of the patient were reviewed by me and considered in my medical decision making (see chart for details).   Right flank versus rib margin pain.  Not associated with any systemic symptoms.  Her lab work-up including CBC and chemistry are normal.  Urinalysis does not reveal evidence of acute infection and she denies urinary symptoms.  Urine pregnancy test negative.  Very reassuring exam I suspect this may be neuropathic potentially  radicular coming from the low thoracic versus upper lumbar region.  Currently taking ibuprofen reports is a lancinating sharp prickly type of discomfort.  Reassuring neurologic exam.  We will obtain right upper quadrant ultrasound and right renal ultrasound to evaluate for acute intra-abdominal pathology low pretest probability low.  ----------------------------------------- 4:14 PM on 02/16/2020 -----------------------------------------  Patient alert in no distress. Very reassuring evaluation. Will trial gabapentin as I suspect this could be radicular type of pain. Her ultrasound and examination of the abdomen chest very reassuring.  Return precautions and treatment recommendations and follow-up discussed with the patient who is agreeable with the plan.       ____________________________________________   FINAL CLINICAL IMPRESSION(S) / ED DIAGNOSES  Final diagnoses:    Right flank pain        Note:  This document was prepared using Dragon voice recognition software and may include unintentional dictation errors       Delman Kitten, MD 02/16/20 1614

## 2020-02-16 NOTE — ED Triage Notes (Signed)
Pt in via POV, reports right side abdominal pain, some nausea, denies V/D.  Pain x one week, reports worsening irritation with alcohol consumption and red meat.  Denies any urinary symptoms.  Vitals WDL, NAD noted at this time.

## 2020-02-16 NOTE — ED Notes (Signed)
See triage note   Presents with pain to right lateral abd  States pain started about 2 weeks ago  No fever or n/v/d   Also denies any urinary sxs'

## 2020-02-18 ENCOUNTER — Ambulatory Visit (INDEPENDENT_AMBULATORY_CARE_PROVIDER_SITE_OTHER): Payer: Medicaid Other | Admitting: Obstetrics and Gynecology

## 2020-02-18 ENCOUNTER — Encounter: Payer: Self-pay | Admitting: Obstetrics and Gynecology

## 2020-02-18 ENCOUNTER — Other Ambulatory Visit: Payer: Self-pay

## 2020-02-18 VITALS — BP 113/75 | HR 88 | Ht 69.0 in | Wt 216.2 lb

## 2020-02-18 DIAGNOSIS — N951 Menopausal and female climacteric states: Secondary | ICD-10-CM | POA: Diagnosis not present

## 2020-02-18 DIAGNOSIS — Z01419 Encounter for gynecological examination (general) (routine) without abnormal findings: Secondary | ICD-10-CM

## 2020-02-18 DIAGNOSIS — Z1231 Encounter for screening mammogram for malignant neoplasm of breast: Secondary | ICD-10-CM | POA: Diagnosis not present

## 2020-02-18 MED ORDER — LEVONORGEST-ETH ESTRAD 91-DAY 0.15-0.03 &0.01 MG PO TABS: 1.0000 | ORAL_TABLET | Freq: Every day | ORAL | 3 refills | Status: DC

## 2020-02-18 NOTE — Progress Notes (Signed)
HPI:      [name] is a 44 y.o. No obstetric history on file. who LMP was No LMP recorded. Patient is perimenopausal.  Subjective:   She presents today for her annual examination.  She continues to experience daily hot flashes, at least 3/day.  She has not had a menstrual period in over a year.  She did try taking OCPs last year and they helped with her hot flashes but she did not continue them.  She is interested in restarting them to control her hot flashes.  She is also interested in OCPs for bone health because she is so young with early menopause.    Hx: The following portions of the patient's history were reviewed and updated as appropriate:             She  has a past medical history of Spina bifida ([place]). She does not have a problem list on file. She  has a past surgical history that includes Appendectomy; Cholecystectomy; and Cesarean section. Her family history includes Breast cancer in her mother; Cancer in her mother; Rheum arthritis in her father. She  reports that she has never smoked. She has never used smokeless tobacco. She reports current alcohol use. She reports that she does not use drugs. She currently has no medications in their medication list. She has No Known Allergies.       Review of Systems:  Review of Systems  Constitutional: Denied constitutional symptoms, night sweats, recent illness, fatigue, fever, insomnia and weight loss.  Eyes: Denied eye symptoms, eye pain, photophobia, vision change and visual disturbance.  Ears/Nose/Throat/Neck: Denied ear, nose, throat or neck symptoms, hearing loss, nasal discharge, sinus congestion and sore throat.  Cardiovascular: Denied cardiovascular symptoms, arrhythmia, chest pain/pressure, edema, exercise intolerance, orthopnea and palpitations.  Respiratory: Denied pulmonary symptoms, asthma, pleuritic pain, productive sputum, cough, dyspnea and wheezing.  Gastrointestinal: Denied, gastro-esophageal reflux,  melena, nausea and vomiting.  Genitourinary: Denied genitourinary symptoms including symptomatic vaginal discharge, pelvic relaxation issues, and urinary complaints.  Musculoskeletal: Denied musculoskeletal symptoms, stiffness, swelling, muscle weakness and myalgia.  Dermatologic: Denied dermatology symptoms, rash and scar.  Neurologic: Denied neurology symptoms, dizziness, headache, neck pain and syncope.  Psychiatric: Denied psychiatric symptoms, anxiety and depression.  Endocrine: See HPI for additional information.   Meds:   No current outpatient medications on file prior to visit.   No current facility-administered medications on file prior to visit.    Objective:     Vitals:   02/18/20 0940  BP: 113/75  Pulse: 88              Physical examination General NAD, Conversant  HEENT Atraumatic; Op clear with mmm.  Normo-cephalic. Pupils reactive. Anicteric sclerae  Thyroid/Neck Smooth without nodularity or enlargement. Normal ROM.  Neck Supple.  Skin No rashes, lesions or ulceration. Normal palpated skin turgor. No nodularity.  Breasts: No masses or discharge.  Symmetric.  No axillary adenopathy.  Lungs: Clear to auscultation.No rales or wheezes. Normal Respiratory effort, no retractions.  Heart: NSR.  No murmurs or rubs appreciated. No periferal edema  Abdomen: Soft.  Non-tender.  No masses.  No HSM. No hernia  Extremities: Moves all appropriately.  Normal ROM for age. No lymphadenopathy.  Neuro: Oriented to PPT.  Normal mood. Normal affect.     Pelvic:   Vulva: Normal appearance.  No lesions.  Vagina: No lesions or abnormalities noted.  Support: Normal pelvic support.  Urethra No masses tenderness or scarring.  Meatus Normal size  without lesions or prolapse.  Cervix: Normal appearance.  No lesions.  Anus: Normal exam.  No lesions.  Perineum: Normal exam.  No lesions.        Bimanual   Uterus: Normal size.  Non-tender.  Mobile.  AV.  Adnexae: No masses.  Non-tender to  palpation.  Cul-de-sac: Negative for abnormality.      Assessment:    No obstetric history on file. There are no problems to display for this patient.    1. Well woman exam with routine gynecological exam   2. Encounter for screening mammogram for malignant neoplasm of breast   3. Symptomatic menopausal or female climacteric states     Patient in early menopause.  Desires OCPs.   Plan:            1.  Basic Screening Recommendations The basic screening recommendations for asymptomatic women were discussed with the patient during her visit.  The age-appropriate recommendations were discussed with her and the rational for the tests reviewed.  When I am informed by the patient that another primary care physician has previously obtained the age-appropriate tests and they are up-to-date, only outstanding tests are ordered and referrals given as necessary.  Abnormal results of tests will be discussed with her when all of her results are completed.  Routine preventative health maintenance measures emphasized: Exercise/Diet/Weight control, Tobacco Warnings, Alcohol/Substance use risks and Stress Management Mammogram ordered 2.  Restart OCPs  Patient does not want to have menstrual periods so she will skip the last week of each pack. Orders Orders Placed This Encounter  Procedures  . MM 3D SCREEN BREAST BILATERAL    No orders of the defined types were placed in this encounter.           F/U  Return in about 1 year (around 02/17/2021) for Annual Physical.  Finis Bud, M.D. 02/18/2020 10:18 AM

## 2020-03-16 ENCOUNTER — Ambulatory Visit
Admission: RE | Admit: 2020-03-16 | Discharge: 2020-03-16 | Disposition: A | Discharge status: Home / Self Care | Payer: Medicaid Other | Source: Ambulatory Visit | Attending: Family Medicine | Admitting: Family Medicine

## 2020-03-16 ENCOUNTER — Other Ambulatory Visit: Payer: Self-pay

## 2020-03-16 DIAGNOSIS — Z1231 Encounter for screening mammogram for malignant neoplasm of breast: Secondary | ICD-10-CM | POA: Diagnosis not present

## 2020-03-22 ENCOUNTER — Encounter: Payer: Self-pay | Admitting: Gastroenterology

## 2020-03-22 ENCOUNTER — Ambulatory Visit (INDEPENDENT_AMBULATORY_CARE_PROVIDER_SITE_OTHER): Payer: Medicaid Other | Admitting: Gastroenterology

## 2020-03-22 ENCOUNTER — Other Ambulatory Visit: Payer: Self-pay

## 2020-03-22 VITALS — BP 112/73 | HR 80 | Temp 98.4°F | Ht 69.0 in | Wt 215.4 lb

## 2020-03-22 DIAGNOSIS — Z1211 Encounter for screening for malignant neoplasm of colon: Secondary | ICD-10-CM | POA: Diagnosis not present

## 2020-03-22 DIAGNOSIS — R1013 Epigastric pain: Secondary | ICD-10-CM

## 2020-03-22 DIAGNOSIS — Z8371 Family history of colonic polyps: Secondary | ICD-10-CM | POA: Diagnosis not present

## 2020-03-22 MED ORDER — NA SULFATE-K SULFATE-MG SULF 17.5-3.13-1.6 GM/177ML PO SOLN: 354.0000 mL | Freq: Once | ORAL | 0 refills | Status: AC

## 2020-03-22 NOTE — Progress Notes (Signed)
[name], MD [address]  [place]  [place], [place] [address]  Main: [phone]  Fax: [phone]    Gastroenterology Consultation  Referring Provider:     [name]* Primary Care Physician:  [name], MD Primary Gastroenterologist:  Dr. [name] Reason for Consultation:     Right sided abdominal pain, nausea, loose stools        HPI:   Gabrielle Gutierrez is a 44 y.o. female referred by Dr. [name], [name], MD  for consultation & management of 1 month history of right-sided abdominal pain, dull, severe in intensity, radiating to back associated with nausea, soft, nonbloody bowel movements.  She has history of cholecystectomy as well as appendectomy.  Patient reports that she modified her diet which provides some relief of her symptoms.  She denies carbonated beverages.  She stays home, not very active.  Patient also wanted to discuss about colonoscopy given her mother with history of several colon polyps.  She reports that her mom has been undergoing colonoscopy since age of 56 every 1 to 2 years and polyps have been found every time she undergoes colonoscopy.  Recent labs from 01/2020 including CBC, CMP were unremarkable Patient does not smoke or drink alcohol  NSAIDs: None  Antiplts/Anticoagulants/Anti thrombotics: None  GI Procedures: None Mother with colon polyps  Past Medical History:  Diagnosis Date   Spina bifida [facility])     Past Surgical History:  Procedure Laterality Date   APPENDECTOMY     CESAREAN SECTION     2   CHOLECYSTECTOMY     Current Outpatient Medications:    ketoconazole (NIZORAL) 2 % cream, Apply topically., Disp: , Rfl:    Levonorgestrel-Ethinyl Estradiol (AMETHIA) 0.15-0.03 &0.01 MG tablet, Take 1 tablet by mouth at bedtime., Disp: 84 tablet, Rfl: 3   meloxicam (MOBIC) 15 MG tablet, Take 15 mg by mouth daily., Disp: , Rfl:    triamcinolone ointment (KENALOG) 0.1 %, Apply  topically., Disp: , Rfl:     Family History  Problem Relation Age of Onset   Cancer Mother    Breast cancer Mother    Rheum arthritis Father      Social History   Tobacco Use   Smoking status: Never Smoker   Smokeless tobacco: Never Used  Vaping Use   Vaping Use: Never used  Substance Use Topics   Alcohol use: Not Currently   Drug use: Never    Allergies as of 03/22/2020   (No Known Allergies)    Review of Systems:    All systems reviewed and negative except where noted in HPI.   Physical Exam:  BP 112/73 (BP Location: Left Arm, Patient Position: Sitting, Cuff Size: Normal)    Pulse 80    Temp 98.4 F (36.9 C) (Oral)    Ht 5\' 9"  (1.753 m)    Wt 215 lb 6 oz (97.7 kg)    BMI 31.81 kg/m  No LMP recorded. Patient is perimenopausal.  General:   Alert,  Well-developed, well-nourished, pleasant and cooperative in NAD Head:  Normocephalic and atraumatic. Eyes:  Sclera clear, no icterus.   Conjunctiva pink. Ears:  Normal auditory acuity. Nose:  No deformity, discharge, or lesions. Mouth:  No deformity or lesions,oropharynx pink & moist. Neck:  Supple; no masses or thyromegaly. Lungs:  Respirations even and unlabored.  Clear throughout to auscultation.   No wheezes, crackles, or rhonchi. No acute distress. Heart:  Regular rate and rhythm; no murmurs, clicks, rubs,  or gallops. Abdomen:  Normal bowel sounds. Soft, non-tender and non-distended without masses, hepatosplenomegaly or hernias noted.  No guarding or rebound tenderness.   Rectal: Not performed Msk:  Symmetrical without gross deformities. Good, equal movement & strength bilaterally. Pulses:  Normal pulses noted. Extremities:  No clubbing or edema.  No cyanosis. Neurologic:  Alert and oriented x3;  grossly normal neurologically. Skin:  Intact without significant lesions or rashes. No jaundice. Psych:  Alert and cooperative. Normal mood and affect.  Imaging Studies: Reviewed  Assessment and Plan:    Gabrielle Gutierrez is a 44 y.o. Spanish-speaking female with overweight, BMI 31.8 is seen in consultation for 1 month history of right-sided abdominal pain associated with nausea and loose stools  Recommend H. pylori breath test and treat if positive  Family history of colon polyps in first-degree relative Recommend screening colonoscopy   Follow up based on above work-up   [name], MD

## 2020-03-22 NOTE — Addendum Note (Signed)
Addended by: Ulyess Blossom L on: 03/22/2020 01:33 PM   Modules accepted: Orders

## 2020-03-23 LAB — H. PYLORI BREATH TEST: H pylori Breath Test: NEGATIVE

## 2020-03-24 ENCOUNTER — Telehealth: Payer: Self-pay

## 2020-03-24 MED ORDER — OMEPRAZOLE 20 MG PO CPDR: 20.0000 mg | DELAYED_RELEASE_CAPSULE | Freq: Two times a day (BID) | ORAL | 0 refills | Status: DC

## 2020-03-24 NOTE — Telephone Encounter (Signed)
-----   Message from [name], MD sent at 03/24/2020  9:14 AM EDT ----- H. pylori breath test came back negative which means she does not have bacterial infection in her stomach.  Recommend trial of omeprazole 20 mg 1-2 times daily before meals, over-the-counter for 2 to 4 weeks if she has ongoing symptomsRohini Vanga

## 2020-03-24 NOTE — Telephone Encounter (Signed)
Patient verbalized understanding of results. Patient will pick up the omeprazole at the pharmacy.

## 2020-04-01 ENCOUNTER — Other Ambulatory Visit: Payer: Self-pay

## 2020-04-01 ENCOUNTER — Other Ambulatory Visit
Admission: RE | Admit: 2020-04-01 | Discharge: 2020-04-01 | Disposition: A | Discharge status: Home / Self Care | Payer: Medicaid Other | Source: Ambulatory Visit | Attending: Gastroenterology | Admitting: Gastroenterology

## 2020-04-01 DIAGNOSIS — Z01812 Encounter for preprocedural laboratory examination: Secondary | ICD-10-CM | POA: Diagnosis not present

## 2020-04-01 DIAGNOSIS — Z20822 Contact with and (suspected) exposure to covid-19: Secondary | ICD-10-CM | POA: Diagnosis not present

## 2020-04-02 LAB — SARS CORONAVIRUS 2 (TAT 6-24 HRS): SARS Coronavirus 2: NEGATIVE

## 2020-04-05 ENCOUNTER — Encounter: Payer: Self-pay | Admitting: Gastroenterology

## 2020-04-05 ENCOUNTER — Ambulatory Visit
Admission: RE | Admit: 2020-04-05 | Discharge: 2020-04-05 | Disposition: A | Discharge status: Home / Self Care | Payer: Medicaid Other | Attending: Gastroenterology | Admitting: Gastroenterology

## 2020-04-05 ENCOUNTER — Ambulatory Visit: Payer: Medicaid Other | Admitting: Certified Registered Nurse Anesthetist

## 2020-04-05 ENCOUNTER — Encounter
Admission: RE | Disposition: A | Discharge status: Home / Self Care | Payer: Self-pay | Source: Home / Self Care | Attending: Gastroenterology

## 2020-04-05 DIAGNOSIS — Z793 Long term (current) use of hormonal contraceptives: Secondary | ICD-10-CM | POA: Insufficient documentation

## 2020-04-05 DIAGNOSIS — Z8371 Family history of colonic polyps: Secondary | ICD-10-CM | POA: Insufficient documentation

## 2020-04-05 DIAGNOSIS — Q059 Spina bifida, unspecified: Secondary | ICD-10-CM | POA: Diagnosis not present

## 2020-04-05 DIAGNOSIS — Z8261 Family history of arthritis: Secondary | ICD-10-CM | POA: Insufficient documentation

## 2020-04-05 DIAGNOSIS — D123 Benign neoplasm of transverse colon: Secondary | ICD-10-CM | POA: Insufficient documentation

## 2020-04-05 DIAGNOSIS — Z803 Family history of malignant neoplasm of breast: Secondary | ICD-10-CM | POA: Diagnosis not present

## 2020-04-05 DIAGNOSIS — K635 Polyp of colon: Secondary | ICD-10-CM

## 2020-04-05 DIAGNOSIS — Z791 Long term (current) use of non-steroidal anti-inflammatories (NSAID): Secondary | ICD-10-CM | POA: Diagnosis not present

## 2020-04-05 DIAGNOSIS — Z1211 Encounter for screening for malignant neoplasm of colon: Secondary | ICD-10-CM | POA: Diagnosis not present

## 2020-04-05 DIAGNOSIS — Z809 Family history of malignant neoplasm, unspecified: Secondary | ICD-10-CM | POA: Diagnosis not present

## 2020-04-05 DIAGNOSIS — Z9049 Acquired absence of other specified parts of digestive tract: Secondary | ICD-10-CM | POA: Insufficient documentation

## 2020-04-05 DIAGNOSIS — Z79899 Other long term (current) drug therapy: Secondary | ICD-10-CM | POA: Diagnosis not present

## 2020-04-05 HISTORY — PX: COLONOSCOPY WITH PROPOFOL: SHX5780

## 2020-04-05 LAB — POCT PREGNANCY, URINE: Preg Test, Ur: NEGATIVE

## 2020-04-05 SURGERY — COLONOSCOPY WITH PROPOFOL
Anesthesia: General

## 2020-04-05 MED ORDER — LIDOCAINE HCL (CARDIAC) PF 100 MG/5ML IV SOSY
PREFILLED_SYRINGE | INTRAVENOUS | Status: DC | PRN
  Administered 2020-04-05: 50 mg via INTRAVENOUS

## 2020-04-05 MED ORDER — PROPOFOL 500 MG/50ML IV EMUL
INTRAVENOUS | Status: DC | PRN
  Administered 2020-04-05: 130 ug/kg/min via INTRAVENOUS

## 2020-04-05 MED ORDER — LIDOCAINE HCL (PF) 1 % IJ SOLN
INTRAMUSCULAR | Status: AC
  Filled 2020-04-05: qty 2

## 2020-04-05 MED ORDER — PROPOFOL 500 MG/50ML IV EMUL
INTRAVENOUS | Status: AC
  Filled 2020-04-05: qty 50

## 2020-04-05 MED ORDER — PROPOFOL 10 MG/ML IV BOLUS
INTRAVENOUS | Status: DC | PRN
  Administered 2020-04-05: 50 mg via INTRAVENOUS
  Administered 2020-04-05: 30 mg via INTRAVENOUS
  Administered 2020-04-05 (×2): 50 mg via INTRAVENOUS

## 2020-04-05 MED ORDER — SODIUM CHLORIDE 0.9 % IV SOLN
INTRAVENOUS | Status: DC
  Administered 2020-04-05: 1000 mL via INTRAVENOUS

## 2020-04-05 NOTE — Anesthesia Postprocedure Evaluation (Signed)
Anesthesia Post Note  Patient: Gabrielle Gutierrez  Procedure(s) Performed: COLONOSCOPY WITH PROPOFOL (N/A )  Patient location during evaluation: PACU Anesthesia Type: General Level of consciousness: awake and alert Pain management: pain level controlled Vital Signs Assessment: post-procedure vital signs reviewed and stable Respiratory status: spontaneous breathing, nonlabored ventilation and respiratory function stable Cardiovascular status: blood pressure returned to baseline and stable Postop Assessment: no apparent nausea or vomiting Anesthetic complications: no   No complications documented.   Last Vitals:  Vitals:   04/05/20 1031 04/05/20 1041  BP: (!) 152/129 122/84  Pulse: 72 63  Resp: 14 (!) 22  Temp:    SpO2: 100% 100%    Last Pain:  Vitals:   04/05/20 1041  TempSrc:   PainSc: 0-No pain                 Brett Canales Amie Cowens

## 2020-04-05 NOTE — Op Note (Signed)
[facility] Gastroenterology Patient Name: Gabrielle Gutierrez Procedure Date: 04/05/2020 9:43 AM MRN: [id number] Account #: [id number] Date of Birth: 1975-12-24 Admit Type: Outpatient Age: 44 Room: [facility] ENDO ROOM 1 Gender: Female Note Status: Finalized Procedure:             Colonoscopy Indications:           Colon cancer screening in patient at increased risk:                         Family history of 1st-degree relative with colon                         polyps, This is the patient's first colonoscopy Providers:             [name] MD, MD Referring MD:          [name] Revelo (Referring MD) Medicines:             General Anesthesia Complications:         No immediate complications. Estimated blood loss: None. Procedure:             Pre-Anesthesia Assessment:                        - Prior to the procedure, a History and Physical was                         performed, and patient medications and allergies were                         reviewed. The patient is competent. The risks and                         benefits of the procedure and the sedation options and                         risks were discussed with the patient. All questions                         were answered and informed consent was obtained.                         Patient identification and proposed procedure were                         verified by the physician, the nurse, the                         anesthesiologist, the anesthetist and the technician                         in the pre-procedure area in the procedure room in the                         endoscopy suite. Mental Status Examination: alert and                         oriented. Airway Examination: normal oropharyngeal  airway and neck mobility. Respiratory Examination:                         clear to auscultation. CV Examination: normal.                         Prophylactic Antibiotics:  The patient does not require                         prophylactic antibiotics. Prior Anticoagulants: The                         patient has taken no previous anticoagulant or                         antiplatelet agents. ASA Grade Assessment: I - A                         normal, healthy patient. After reviewing the risks and                         benefits, the patient was deemed in satisfactory                         condition to undergo the procedure. The anesthesia                         plan was to use general anesthesia. Immediately prior                         to administration of medications, the patient was                         re-assessed for adequacy to receive sedatives. The                         heart rate, respiratory rate, oxygen saturations,                         blood pressure, adequacy of pulmonary ventilation, and                         response to care were monitored throughout the                         procedure. The physical status of the patient was                         re-assessed after the procedure.                        After obtaining informed consent, the colonoscope was                         passed under direct vision. Throughout the procedure,                         the patient's blood pressure, pulse, and oxygen  saturations were monitored continuously. The                         Colonoscope was introduced through the anus and                         advanced to the the terminal ileum, with                         identification of the appendiceal orifice and IC                         valve. The colonoscopy was performed without                         difficulty. The patient tolerated the procedure well.                         The quality of the bowel preparation was evaluated                         using the BBPS [facility] Bowel Preparation Scale) with                         scores of: Right Colon = 3,  Transverse Colon = 3 and                         Left Colon = 3 (entire mucosa seen well with no                         residual staining, small fragments of stool or opaque                         liquid). The total BBPS score equals 9. Findings:      The perianal and digital rectal examinations were normal. Pertinent       negatives include normal sphincter tone and no palpable rectal lesions.      The terminal ileum appeared normal.      A 5 mm polyp was found in the transverse colon. The polyp was sessile.       The polyp was removed with a cold snare. Resection and retrieval were       complete.      Normal mucosa was found in the entire colon. Biopsies were taken with a       cold forceps for histology.      The retroflexed view of the distal rectum and anal verge was normal and       showed no anal or rectal abnormalities. Impression:            - The examined portion of the ileum was normal.                        - One 5 mm polyp in the transverse colon, removed with                         a cold snare. Resected and retrieved.                        -  Normal mucosa in the entire examined colon. Biopsied.                        - The distal rectum and anal verge are normal on                         retroflexion view. Recommendation:        - Discharge patient to home (with escort).                        - Resume previous diet today.                        - Continue present medications.                        - Await pathology results.                        - Repeat colonoscopy in 7 years for surveillance. Procedure Code(s):     --- Professional ---                        [id number], Colonoscopy, flexible; with removal of                         tumor(s), polyp(s), or other lesion(s) by snare                         technique                        45380, 12, Colonoscopy, flexible; with biopsy, single                         or multiple Diagnosis Code(s):     --- Professional  ---                        Z83.71, Family history of colonic polyps                        K63.5, Polyp of colon CPT copyright 2019 American Medical Association. All rights reserved. The codes documented in this report are preliminary and upon coder review may  be revised to meet current compliance requirements. Dr. Ulyess Mort [name] MD, MD 04/05/2020 10:09:52 AM This report has been signed electronically. Number of Addenda: 0 Note Initiated On: 04/05/2020 9:43 AM Scope Withdrawal Time: 0 hours 14 minutes 57 seconds  Total Procedure Duration: 0 hours 18 minutes 31 seconds  Estimated Blood Loss:  Estimated blood loss: none.      [facility]

## 2020-04-05 NOTE — H&P (Signed)
[name], MD [address]  [place]  [place], [place] [address]  Main: [phone]  Fax: [phone] Pager: [phone]  Primary Care Physician:  [name], MD Primary Gastroenterologist:  Dr. [name]  Pre-Procedure History & Physical: HPI:  Gabrielle Gutierrez is a 43 y.o. female is here for an colonoscopy.   Past Medical History:  Diagnosis Date  . Spina bifida [facility])     Past Surgical History:  Procedure Laterality Date  . APPENDECTOMY    . CESAREAN SECTION     2  . CHOLECYSTECTOMY      Prior to Admission medications   Medication Sig Start Date End Date Taking? Authorizing Provider  ketoconazole (NIZORAL) 2 % cream Apply topically. 08/07/19   [provider]  Levonorgestrel-Ethinyl Estradiol (AMETHIA) 0.15-0.03 &0.01 MG tablet Take 1 tablet by mouth at bedtime. 02/18/20   Harlin Heys, MD  meloxicam (MOBIC) 15 MG tablet Take 15 mg by mouth daily. 11/18/19   [provider]  omeprazole (PRILOSEC) 20 MG capsule Take 1 capsule (20 mg total) by mouth 2 (two) times daily before a meal. 03/24/20   Jorge Amparo, Tally Due, MD  triamcinolone ointment (KENALOG) 0.1 % Apply topically. 07/28/19 07/27/20  [provider]    Allergies as of 03/22/2020  . (No Known Allergies)    Family History  Problem Relation Age of Onset  . Cancer Mother   . Breast cancer Mother   . Rheum arthritis Father     Social History   Socioeconomic History  . Marital status: Married    Spouse name: Not on file  . Number of children: Not on file  . Years of education: Not on file  . Highest education level: Not on file  Occupational History  . Not on file  Tobacco Use  . Smoking status: Never Smoker  . Smokeless tobacco: Never Used  Vaping Use  . Vaping Use: Never used  Substance and Sexual Activity  . Alcohol use: Not Currently  . Drug use: Never  . Sexual activity: Not on file  Other Topics Concern  . Not on file  Social  History Narrative  . Not on file   Social Determinants of Health   Financial Resource Strain:   . Difficulty of Paying Living Expenses: Not on file  Food Insecurity:   . Worried About Charity fundraiser in the Last Year: Not on file  . Ran Out of Food in the Last Year: Not on file  Transportation Needs:   . Lack of Transportation (Medical): Not on file  . Lack of Transportation (Non-Medical): Not on file  Physical Activity:   . Days of Exercise per Week: Not on file  . Minutes of Exercise per Session: Not on file  Stress:   . Feeling of Stress : Not on file  Social Connections:   . Frequency of Communication with Friends and Family: Not on file  . Frequency of Social Gatherings with Friends and Family: Not on file  . Attends Religious Services: Not on file  . Active Member of Clubs or Organizations: Not on file  . Attends Archivist Meetings: Not on file  . Marital Status: Not on file  Intimate Partner Violence:   . Fear of Current or Ex-Partner: Not on file  . Emotionally Abused: Not on file  . Physically Abused: Not on file  . Sexually Abused: Not on file    Review of Systems: See HPI, otherwise negative ROS  Physical Exam: BP 111/66   Pulse 72   Temp 98.4 F (36.9 C) (Temporal)   Resp 17   Ht 5\' 9"  (1.753 m)   Wt 97.7 kg   SpO2 100%   BMI 31.81 kg/m  General:   Alert,  pleasant and cooperative in NAD Head:  Normocephalic and atraumatic. Neck:  Supple; no masses or thyromegaly. Lungs:  Clear throughout to auscultation.    Heart:  Regular rate and rhythm. Abdomen:  Soft, nontender and nondistended. Normal bowel sounds, without guarding, and without rebound.   Neurologic:  Alert and  oriented x4;  grossly normal neurologically.  Impression/Plan: Gabrielle Gutierrez is here for an colonoscopy to be performed for Family history of colon polyps in first-degree relative Recommend screening colonoscopy  Risks, benefits, limitations, and alternatives  regarding  colonoscopy have been reviewed with the patient.  Questions have been answered.  All parties agreeable.   Sherri Sear, MD  04/05/2020, 9:38 AM

## 2020-04-05 NOTE — Anesthesia Preprocedure Evaluation (Signed)
Anesthesia Evaluation  Patient identified by MRN, date of birth, ID band Patient awake    Reviewed: Allergy & Precautions, H&P , NPO status , Patient's Chart, lab work & pertinent test results  History of Anesthesia Complications Negative for: history of anesthetic complications  Airway Mallampati: II  TM Distance: >3 FB Neck ROM: full    Dental  (+) Teeth Intact   Pulmonary neg pulmonary ROS, neg sleep apnea, neg COPD,    breath sounds clear to auscultation       Cardiovascular (-) angina(-) Past MI and (-) Cardiac Stents negative cardio ROS  (-) dysrhythmias  Rhythm:regular Rate:Normal     Neuro/Psych negative neurological ROS  negative psych ROS   GI/Hepatic negative GI ROS, Neg liver ROS,   Endo/Other  negative endocrine ROS  Renal/GU negative Renal ROS  negative genitourinary   Musculoskeletal   Abdominal   Peds  Hematology negative hematology ROS (+)   Anesthesia Other Findings Past Medical History: No date: Spina bifida ([place])  Past Surgical History: No date: APPENDECTOMY No date: CESAREAN SECTION     Comment:  2 No date: CHOLECYSTECTOMY  BMI    Body Mass Index: 31.81 kg/m      Reproductive/Obstetrics negative OB ROS                             Anesthesia Physical Anesthesia Plan  ASA: I  Anesthesia Plan: General   Post-op Pain Management:    Induction:   PONV Risk Score and Plan: Propofol infusion and TIVA  Airway Management Planned: Nasal Cannula  Additional Equipment:   Intra-op Plan:   Post-operative Plan:   Informed Consent: I have reviewed the patients History and Physical, chart, labs and discussed the procedure including the risks, benefits and alternatives for the proposed anesthesia with the patient or authorized representative who has indicated his/her understanding and acceptance.     Dental Advisory Given  Plan Discussed with:  Anesthesiologist, CRNA and Surgeon  Anesthesia Plan Comments:         Anesthesia Quick Evaluation

## 2020-04-05 NOTE — Transfer of Care (Signed)
Immediate Anesthesia Transfer of Care Note  Patient: Gabrielle Gutierrez  Procedure(s) Performed: COLONOSCOPY WITH PROPOFOL (N/A )  Patient Location: PACU and Endoscopy Unit  Anesthesia Type:General  Level of Consciousness: drowsy  Airway & Oxygen Therapy: Patient Spontanous Breathing  Post-op Assessment: Report given to RN and Post -op Vital signs reviewed and stable  Post vital signs: Reviewed and stable  Last Vitals:  Vitals Value Taken Time  BP 116/89 04/05/20 1011  Temp 36.4 C 04/05/20 1011  Pulse 80 04/05/20 1012  Resp 16 04/05/20 1012  SpO2 100 % 04/05/20 1012  Vitals shown include unvalidated device data.  Last Pain:  Vitals:   04/05/20 1011  TempSrc: Temporal  PainSc: Asleep         Complications: No complications documented.

## 2020-04-05 NOTE — Anesthesia Procedure Notes (Signed)
Procedure Name: MAC Date/Time: 04/05/2020 9:45 AM Performed by: Lily Peer, Mende Biswell, CRNA Pre-anesthesia Checklist: Patient identified, Emergency Drugs available, Suction available, Patient being monitored and Timeout performed Patient Re-evaluated:Patient Re-evaluated prior to induction Oxygen Delivery Method: Nasal cannula

## 2020-04-06 ENCOUNTER — Encounter: Payer: Self-pay | Admitting: Gastroenterology

## 2020-04-06 ENCOUNTER — Telehealth: Payer: Self-pay

## 2020-04-06 LAB — SURGICAL PATHOLOGY

## 2020-04-06 MED ORDER — DICYCLOMINE HCL 10 MG PO CAPS: 10.0000 mg | ORAL_CAPSULE | Freq: Three times a day (TID) | ORAL | 1 refills | Status: DC

## 2020-04-06 NOTE — Telephone Encounter (Signed)
Can you please call patient. Sent Dicyclomine to the pharmacy [facility] [address]

## 2020-04-06 NOTE — Telephone Encounter (Signed)
Called patient and left her a detailed message in Spanish since she did not answer. I left her my name and phone number in case she had any questions.

## 2020-04-06 NOTE — Telephone Encounter (Signed)
-----   Message from [name], MD sent at 04/06/2020  2:50 PM EST ----- There is no inflammation in her colon.  She likely has irritable bowel syndrome.  Recommend trial of Bentyl 10 mg 3 times a day as needed for abdominal pain and loose stools  Rohini Vanga

## 2020-05-29 ENCOUNTER — Other Ambulatory Visit: Payer: Self-pay

## 2020-05-29 ENCOUNTER — Other Ambulatory Visit: Payer: Medicaid Other

## 2020-05-29 DIAGNOSIS — Z20822 Contact with and (suspected) exposure to covid-19: Secondary | ICD-10-CM

## 2020-05-31 LAB — NOVEL CORONAVIRUS, NAA: SARS-CoV-2, NAA: NOT DETECTED

## 2020-05-31 LAB — SARS-COV-2, NAA 2 DAY TAT

## 2020-06-01 ENCOUNTER — Other Ambulatory Visit: Payer: Self-pay | Admitting: Family Medicine

## 2020-06-01 DIAGNOSIS — Z1231 Encounter for screening mammogram for malignant neoplasm of breast: Secondary | ICD-10-CM

## 2020-08-02 ENCOUNTER — Other Ambulatory Visit: Payer: Self-pay

## 2020-08-02 ENCOUNTER — Encounter: Payer: Self-pay | Admitting: Gastroenterology

## 2020-08-02 ENCOUNTER — Ambulatory Visit (INDEPENDENT_AMBULATORY_CARE_PROVIDER_SITE_OTHER): Payer: Medicaid Other | Admitting: Gastroenterology

## 2020-08-02 VITALS — BP 109/70 | HR 69 | Wt 210.0 lb

## 2020-08-02 DIAGNOSIS — R1011 Right upper quadrant pain: Secondary | ICD-10-CM | POA: Diagnosis not present

## 2020-08-02 DIAGNOSIS — G8929 Other chronic pain: Secondary | ICD-10-CM | POA: Diagnosis not present

## 2020-08-02 DIAGNOSIS — R140 Abdominal distension (gaseous): Secondary | ICD-10-CM

## 2020-08-02 DIAGNOSIS — K760 Fatty (change of) liver, not elsewhere classified: Secondary | ICD-10-CM

## 2020-08-02 NOTE — Progress Notes (Signed)
[name], MD [address]  [place]  [place], [place] [address]  Main: [phone]  Fax: [phone]    Gastroenterology Consultation  Referring Provider:     [name]* Primary Care Physician:  [name], MD Primary Gastroenterologist:  Dr. [name] Reason for Consultation:   Chronic right upper quadrant pain        HPI:   Gabrielle Gutierrez is a 45 y.o. female referred by Dr. [name], [name], MD  for consultation & management of 1 month history of right-sided abdominal pain, dull, severe in intensity, radiating to back associated with nausea, soft, nonbloody bowel movements.  She has history of cholecystectomy as well as appendectomy.  Patient reports that she modified her diet which provides some relief of her symptoms.  She denies carbonated beverages.  She stays home, not very active.  Patient also wanted to discuss about colonoscopy given her mother with history of several colon polyps.  She reports that her mom has been undergoing colonoscopy since age of 72 every 1 to 2 years and polyps have been found every time she undergoes colonoscopy.  Recent labs from 01/2020 including CBC, CMP were unremarkable Patient does not smoke or drink alcohol  Follow-up visit 08/02/2020 Patient is here for follow-up of chronic right upper quadrant pain.  She had right upper quadrant pain associated with nausea and loose stool, H. pylori breath test was negative.  Repeat LFTs came back normal.  Right upper quadrant ultrasound revealed fatty liver, status post cholecystectomy and no biliary dilatation.  She reports that for new year, she had few beers which were flavored, which resulted in worsening of right upper quadrant pain associated with loose stools.  She went to see her PCP, she was told that her lipase was elevated.  A week later, lipase was rechecked and it was still elevated.  She was told that it could be related to pancreas and was  advised to make an appointment to see GI.  I do not have access to those labs.  Patient reports ongoing right upper quadrant pain.  She denies loose stools at this time.  She denies any rectal bleeding, nausea or vomiting.  She does notice fatty foods worsening the pain.  She also has abdominal bloating.  She just started walking about a week ago at [place]. She stopped meloxicam as well as omeprazole  NSAIDs: None  Antiplts/Anticoagulants/Anti thrombotics: None  GI Procedures:  Colonoscopy 04/05/2020 - The examined portion of the ileum was normal. - One 5 mm polyp in the transverse colon, removed with a cold snare. Resected and retrieved. - Normal mucosa in the entire examined colon. Biopsied. - The distal rectum and anal verge are normal on retroflexion view. DIAGNOSIS:  A. COLON, RANDOM; COLD BIOPSY:  - BENIGN COLONIC MUCOSA WITH NO SIGNIFICANT HISTOPATHOLOGIC CHANGE.  - NEGATIVE FOR FEATURES OF MICROSCOPIC COLITIS.  - NEGATIVE FOR DYSPLASIA AND MALIGNANCY.   B. COLON POLYP, TRANSVERSE; COLD SNARE:  - SESSILE SERRATED POLYP.  - NEGATIVE FOR DYSPLASIA AND MALIGNANCY.  Mother with colon polyps  Past Medical History:  Diagnosis Date  . Spina bifida [facility])     Past Surgical History:  Procedure Laterality Date  . APPENDECTOMY    . CESAREAN SECTION     2  . CHOLECYSTECTOMY    . COLONOSCOPY WITH PROPOFOL N/A 04/05/2020   Procedure: COLONOSCOPY WITH PROPOFOL;  Surgeon: [name], MD;  Location: [facility] ENDOSCOPY;  Service: Gastroenterology;  Laterality:  N/A;   Current Outpatient Medications:  .  gabapentin (NEURONTIN) 100 MG capsule, gabapentin 100 mg capsule, Disp: , Rfl:  .  ketoconazole (NIZORAL) 2 % cream, Apply topically., Disp: , Rfl:  .  Levonorgestrel-Ethinyl Estradiol (AMETHIA) 0.15-0.03 &0.01 MG tablet, Take 1 tablet by mouth at bedtime., Disp: 84 tablet, Rfl: 3 .  meloxicam (MOBIC) 15 MG tablet, Take 15 mg by mouth daily., Disp: , Rfl:  .  omeprazole  (PRILOSEC) 20 MG capsule, Take 1 capsule (20 mg total) by mouth 2 (two) times daily before a meal., Disp: 60 capsule, Rfl: 0    Family History  Problem Relation Age of Onset  . Cancer Mother   . Breast cancer Mother   . Rheum arthritis Father      Social History   Tobacco Use  . Smoking status: Never Smoker  . Smokeless tobacco: Never Used  Vaping Use  . Vaping Use: Never used  Substance Use Topics  . Alcohol use: Not Currently  . Drug use: Never    Allergies as of 08/02/2020  . (No Known Allergies)    Review of Systems:    All systems reviewed and negative except where noted in HPI.   Physical Exam:  BP 109/70   Pulse 69   Wt 210 lb (95.3 kg)   BMI 31.01 kg/m  No LMP recorded. Patient is perimenopausal.  General:   Alert,  Well-developed, well-nourished, pleasant and cooperative in NAD Head:  Normocephalic and atraumatic. Eyes:  Sclera clear, no icterus.   Conjunctiva pink. Ears:  Normal auditory acuity. Nose:  No deformity, discharge, or lesions. Mouth:  No deformity or lesions,oropharynx pink & moist. Neck:  Supple; no masses or thyromegaly. Lungs:  Respirations even and unlabored.  Clear throughout to auscultation.   No wheezes, crackles, or rhonchi. No acute distress. Heart:  Regular rate and rhythm; no murmurs, clicks, rubs, or gallops. Abdomen:  Normal bowel sounds. Soft, mild right upper quadrant tenderness, moderately distended without masses, hepatosplenomegaly or hernias noted.  No guarding or rebound tenderness.   Rectal: Not performed Msk:  Symmetrical without gross deformities. Good, equal movement & strength bilaterally. Pulses:  Normal pulses noted. Extremities:  No clubbing or edema.  No cyanosis. Neurologic:  Alert and oriented x3;  grossly normal neurologically. Skin:  Intact without significant lesions or rashes. No jaundice. Psych:  Alert and cooperative. Normal mood and affect.  Imaging Studies: Reviewed  Assessment and Plan:    Gabrielle Gutierrez is a 45 y.o. Spanish-speaking female with overweight, BMI 31.8, s/p cholecystectomy more than 3 years ago is seen in consultation for chronic history of right upper quadrant pain, abdominal bloating and intermittent loose stools.  LFTs were normal.  H. pylori breath test negative.  Colonoscopy was unremarkable, random colon biopsies are negative.  Right upper quadrant ultrasound revealed fatty liver only  Chronic right upper quadrant pain Recheck LFTs, CBC, lipase Recommend CT abdomen and pelvis to evaluate pancreas Recommend upper endoscopy with gastric and duodenal biopsies, rule out peptic ulcer disease If above work-up is negative, will check pancreatic fecal elastase levels, trial of pancreatic enzymes or bile acid sequestrants or treat as functional dyspepsia  Sessile serrated polyp less than 1 cm Recommend surveillance colonoscopy in 03/2027  Fatty liver Last LFTs were normal, recheck LFTs today  Follow up in 2 months   [name], MD

## 2020-08-05 ENCOUNTER — Telehealth: Payer: Self-pay

## 2020-08-05 LAB — LIPASE: Lipase: 22 U/L (ref 14–72)

## 2020-08-05 LAB — CBC
Hematocrit: 41.7 % (ref 34.0–46.6)
Hemoglobin: 13.8 g/dL (ref 11.1–15.9)
MCH: 29.7 pg (ref 26.6–33.0)
MCHC: 33.1 g/dL (ref 31.5–35.7)
MCV: 90 fL (ref 79–97)
Platelets: 270 10*3/uL (ref 150–450)
RBC: 4.64 x10E6/uL (ref 3.77–5.28)
RDW: 13 % (ref 11.7–15.4)
WBC: 4.8 10*3/uL (ref 3.4–10.8)

## 2020-08-05 LAB — HEPATIC FUNCTION PANEL
ALT: 30 IU/L (ref 0–32)
AST: 21 IU/L (ref 0–40)
Albumin: 4.5 g/dL (ref 3.8–4.8)
Alkaline Phosphatase: 80 IU/L (ref 44–121)
Bilirubin Total: 0.5 mg/dL (ref 0.0–1.2)
Bilirubin, Direct: 0.15 mg/dL (ref 0.00–0.40)
Total Protein: 7.3 g/dL (ref 6.0–8.5)

## 2020-08-05 NOTE — Telephone Encounter (Signed)
Used interpreter services and patient verbalized understanding

## 2020-08-05 NOTE — Telephone Encounter (Signed)
-----   Message from [name], MD sent at 08/05/2020  4:42 PM EDT ----- Normal labs  RV

## 2020-08-06 ENCOUNTER — Other Ambulatory Visit: Admission: RE | Admit: 2020-08-06 | Payer: Medicaid Other | Source: Ambulatory Visit

## 2020-08-09 ENCOUNTER — Telehealth: Payer: Self-pay | Admitting: Gastroenterology

## 2020-08-09 ENCOUNTER — Other Ambulatory Visit: Admission: RE | Admit: 2020-08-09 | Payer: Medicaid Other | Source: Ambulatory Visit

## 2020-08-09 NOTE — Telephone Encounter (Signed)
Patient came by office with her daughter.  Patient has not had a Covid test and procedure is scheduled for tomorrow.  I told patient that she would need to be rescheduled.  Please call her to reschedule procedure

## 2020-08-09 NOTE — Telephone Encounter (Signed)
Called and left a message for call back. Tried to call home number and the line is busy

## 2020-08-10 ENCOUNTER — Encounter: Admission: RE | Payer: Self-pay | Source: Home / Self Care

## 2020-08-10 ENCOUNTER — Ambulatory Visit: Admission: RE | Admit: 2020-08-10 | Payer: Medicaid Other | Source: Home / Self Care | Admitting: Gastroenterology

## 2020-08-10 SURGERY — ESOPHAGOGASTRODUODENOSCOPY (EGD) WITH PROPOFOL
Anesthesia: General

## 2020-08-10 NOTE — Telephone Encounter (Signed)
Used interpreter service and called and left a message for call back

## 2020-08-18 ENCOUNTER — Ambulatory Visit
Admission: RE | Admit: 2020-08-18 | Discharge: 2020-08-18 | Disposition: A | Discharge status: Home / Self Care | Payer: Medicaid Other | Source: Ambulatory Visit | Attending: Gastroenterology | Admitting: Gastroenterology

## 2020-08-18 ENCOUNTER — Ambulatory Visit: Payer: Medicaid Other

## 2020-08-18 ENCOUNTER — Other Ambulatory Visit: Payer: Self-pay

## 2020-08-18 DIAGNOSIS — G8929 Other chronic pain: Secondary | ICD-10-CM | POA: Diagnosis present

## 2020-08-18 DIAGNOSIS — K760 Fatty (change of) liver, not elsewhere classified: Secondary | ICD-10-CM | POA: Insufficient documentation

## 2020-08-18 DIAGNOSIS — R1011 Right upper quadrant pain: Secondary | ICD-10-CM | POA: Insufficient documentation

## 2020-08-18 MED ORDER — IOHEXOL 300 MG/ML  SOLN
100.0000 mL | Freq: Once | INTRAMUSCULAR | Status: AC | PRN
  Administered 2020-08-18: 100 mL via INTRAVENOUS

## 2020-08-25 ENCOUNTER — Telehealth: Payer: Self-pay

## 2020-08-25 NOTE — Telephone Encounter (Signed)
-----   Message from [name], MD sent at 08/25/2020 11:41 AM EDT ----- CT scan of her abdomen and pelvis came back normal.  Neck step is to get an upper endoscopy  RV

## 2020-08-26 NOTE — Telephone Encounter (Signed)
Used interpretor  service and they called patient and left a message for call back

## 2020-08-26 NOTE — Telephone Encounter (Signed)
Used interpretor  service and they called patient twice and left a message for call back

## 2020-09-01 ENCOUNTER — Ambulatory Visit: Payer: Medicaid Other

## 2020-09-01 ENCOUNTER — Other Ambulatory Visit: Payer: Self-pay

## 2020-09-01 ENCOUNTER — Encounter: Payer: Self-pay | Admitting: Gastroenterology

## 2020-09-01 ENCOUNTER — Ambulatory Visit (INDEPENDENT_AMBULATORY_CARE_PROVIDER_SITE_OTHER): Payer: Medicaid Other | Admitting: Gastroenterology

## 2020-09-01 VITALS — BP 121/83 | HR 74 | Temp 98.1°F | Ht 69.0 in | Wt 206.5 lb

## 2020-09-01 DIAGNOSIS — R1011 Right upper quadrant pain: Secondary | ICD-10-CM

## 2020-09-01 DIAGNOSIS — G8929 Other chronic pain: Secondary | ICD-10-CM

## 2020-09-01 NOTE — Patient Instructions (Signed)
Gave Creon samples. Take 2 capsules with the first bite of each meal and 1 capsule with the first bite of each snack

## 2020-09-01 NOTE — Progress Notes (Signed)
[name], MD [address]  [place]  [place], [place] [address]  Main: [phone]  Fax: [phone]    Gastroenterology Consultation  Referring Provider:     [name]* Primary Care Physician:  [name], MD Primary Gastroenterologist:  Dr. [name] Reason for Consultation:   Chronic right upper quadrant pain        HPI:   Guillermina Gutierrez is a 45 y.o. female referred by Dr. [name], [name], MD  for consultation & management of 1 month history of right-sided abdominal pain, dull, severe in intensity, radiating to back associated with nausea, soft, nonbloody bowel movements.  She has history of cholecystectomy as well as appendectomy.  Patient reports that she modified her diet which provides some relief of her symptoms.  She denies carbonated beverages.  She stays home, not very active.  Patient also wanted to discuss about colonoscopy given her mother with history of several colon polyps.  She reports that her mom has been undergoing colonoscopy since age of 67 every 1 to 2 years and polyps have been found every time she undergoes colonoscopy.  Recent labs from 01/2020 including CBC, CMP were unremarkable Patient does not smoke or drink alcohol  Follow-up visit 08/02/2020 Patient is here for follow-up of chronic right upper quadrant pain.  She had right upper quadrant pain associated with nausea and loose stool, H. pylori breath test was negative.  Repeat LFTs came back normal.  Right upper quadrant ultrasound revealed fatty liver, status post cholecystectomy and no biliary dilatation.  She reports that for new year, she had few beers which were flavored, which resulted in worsening of right upper quadrant pain associated with loose stools.  She went to see her PCP, she was told that her lipase was elevated.  A week later, lipase was rechecked and it was still elevated.  She was told that it could be related to pancreas and was  advised to make an appointment to see GI.  I do not have access to those labs.  Patient reports ongoing right upper quadrant pain.  She denies loose stools at this time.  She denies any rectal bleeding, nausea or vomiting.  She does notice fatty foods worsening the pain.  She also has abdominal bloating.  She just started walking about a week ago at [place]. She stopped meloxicam as well as omeprazole  Follow-up visit 09/01/2020 Patient continues to have right upper quadrant pain.  She did not undergo upper endoscopy yet.  She tells me that the pain has been intermittent and more or less constant, particularly not related to food.  She points out that her pain to the right upper quadrant as well as to the right lower rib cage posteriorly.  Today, she also tells me that her pain is triggered after movement at work, is also experiencing pain in her both hips.  She is trying to lose weight, lost about 10 pounds watching her diet.   NSAIDs: None  Antiplts/Anticoagulants/Anti thrombotics: None  GI Procedures:  Colonoscopy 04/05/2020 - The examined portion of the ileum was normal. - One 5 mm polyp in the transverse colon, removed with a cold snare. Resected and retrieved. - Normal mucosa in the entire examined colon. Biopsied. - The distal rectum and anal verge are normal on retroflexion view. DIAGNOSIS:  A. COLON, RANDOM; COLD BIOPSY:  - BENIGN COLONIC MUCOSA WITH NO SIGNIFICANT HISTOPATHOLOGIC CHANGE.  - NEGATIVE FOR FEATURES OF MICROSCOPIC COLITIS.  -  NEGATIVE FOR DYSPLASIA AND MALIGNANCY.   B. COLON POLYP, TRANSVERSE; COLD SNARE:  - SESSILE SERRATED POLYP.  - NEGATIVE FOR DYSPLASIA AND MALIGNANCY.  Mother with colon polyps  Past Medical History:  Diagnosis Date  . Spina bifida [facility])     Past Surgical History:  Procedure Laterality Date  . APPENDECTOMY    . CESAREAN SECTION     2  . CHOLECYSTECTOMY    . COLONOSCOPY WITH PROPOFOL N/A 04/05/2020   Procedure: COLONOSCOPY WITH  PROPOFOL;  Surgeon: [name], MD;  Location: [facility] ENDOSCOPY;  Service: Gastroenterology;  Laterality: N/A;   Current Outpatient Medications:  .  gabapentin (NEURONTIN) 100 MG capsule, gabapentin 100 mg capsule, Disp: , Rfl:  .  ketoconazole (NIZORAL) 2 % cream, Apply topically., Disp: , Rfl:  .  Levonorgestrel-Ethinyl Estradiol (AMETHIA) 0.15-0.03 &0.01 MG tablet, Take 1 tablet by mouth at bedtime., Disp: 84 tablet, Rfl: 3 .  meloxicam (MOBIC) 15 MG tablet, Take 15 mg by mouth daily., Disp: , Rfl:  .  omeprazole (PRILOSEC) 20 MG capsule, Take 1 capsule (20 mg total) by mouth 2 (two) times daily before a meal., Disp: 60 capsule, Rfl: 0    Family History  Problem Relation Age of Onset  . Cancer Mother   . Breast cancer Mother   . Rheum arthritis Father      Social History   Tobacco Use  . Smoking status: Never Smoker  . Smokeless tobacco: Never Used  Vaping Use  . Vaping Use: Never used  Substance Use Topics  . Alcohol use: Not Currently  . Drug use: Never    Allergies as of 09/01/2020  . (No Known Allergies)    Review of Systems:    All systems reviewed and negative except where noted in HPI.   Physical Exam:  BP 121/83 (BP Location: Left Arm, Patient Position: Sitting, Cuff Size: Normal)   Pulse 74   Temp 98.1 F (36.7 C) (Oral)   Ht 5\' 9"  (1.753 m)   Wt 206 lb 8 oz (93.7 kg)   BMI 30.49 kg/m  No LMP recorded. Patient is perimenopausal.  General:   Alert,  Well-developed, well-nourished, pleasant and cooperative in NAD Head:  Normocephalic and atraumatic. Eyes:  Sclera clear, no icterus.   Conjunctiva pink. Ears:  Normal auditory acuity. Nose:  No deformity, discharge, or lesions. Mouth:  No deformity or lesions,oropharynx pink & moist. Neck:  Supple; no masses or thyromegaly. Lungs:  Respirations even and unlabored.  Clear throughout to auscultation.   No wheezes, crackles, or rhonchi. No acute distress. Heart:  Regular rate and rhythm; no murmurs,  clicks, rubs, or gallops. Abdomen:  Normal bowel sounds. Soft, mild right upper quadrant tenderness, moderately distended without masses, hepatosplenomegaly or hernias noted.  No guarding or rebound tenderness.   Rectal: Not performed Msk:  Symmetrical without gross deformities. Good, equal movement & strength bilaterally. Pulses:  Normal pulses noted. Extremities:  No clubbing or edema.  No cyanosis. Neurologic:  Alert and oriented x3;  grossly normal neurologically. Skin:  Intact without significant lesions or rashes. No jaundice. Psych:  Alert and cooperative. Normal mood and affect.  Imaging Studies: Reviewed  Assessment and Plan:   Chandy Tarman is a 45 y.o. Spanish-speaking female with overweight, BMI 31.8, s/p cholecystectomy more than 3 years ago is seen in consultation for chronic history of right upper quadrant pain, abdominal bloating and intermittent loose stools.  LFTs were normal.  H. pylori breath test negative.  Colonoscopy was unremarkable,  random colon biopsies are negative.  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

## 2020-09-14 ENCOUNTER — Ambulatory Visit: Payer: Medicaid Other | Admitting: Anesthesiology

## 2020-09-14 ENCOUNTER — Encounter
Admission: RE | Disposition: A | Discharge status: Home / Self Care | Payer: Self-pay | Source: Home / Self Care | Attending: Gastroenterology

## 2020-09-14 ENCOUNTER — Ambulatory Visit
Admission: RE | Admit: 2020-09-14 | Discharge: 2020-09-14 | Disposition: A | Discharge status: Home / Self Care | Payer: Medicaid Other | Attending: Gastroenterology | Admitting: Gastroenterology

## 2020-09-14 ENCOUNTER — Other Ambulatory Visit: Payer: Self-pay

## 2020-09-14 ENCOUNTER — Encounter: Payer: Self-pay | Admitting: Gastroenterology

## 2020-09-14 DIAGNOSIS — G8929 Other chronic pain: Secondary | ICD-10-CM | POA: Diagnosis not present

## 2020-09-14 DIAGNOSIS — R1011 Right upper quadrant pain: Secondary | ICD-10-CM | POA: Insufficient documentation

## 2020-09-14 HISTORY — PX: ESOPHAGOGASTRODUODENOSCOPY (EGD) WITH PROPOFOL: SHX5813

## 2020-09-14 LAB — POCT PREGNANCY, URINE: Preg Test, Ur: NEGATIVE

## 2020-09-14 SURGERY — ESOPHAGOGASTRODUODENOSCOPY (EGD) WITH PROPOFOL
Anesthesia: General

## 2020-09-14 MED ORDER — LIDOCAINE HCL (PF) 2 % IJ SOLN
INTRAMUSCULAR | Status: AC
  Filled 2020-09-14: qty 5

## 2020-09-14 MED ORDER — SODIUM CHLORIDE 0.9 % IV SOLN: INTRAVENOUS | Status: DC

## 2020-09-14 MED ORDER — PROPOFOL 500 MG/50ML IV EMUL
INTRAVENOUS | Status: DC | PRN
  Administered 2020-09-14: 120 ug/kg/min via INTRAVENOUS

## 2020-09-14 MED ORDER — PROPOFOL 500 MG/50ML IV EMUL
INTRAVENOUS | Status: AC
  Filled 2020-09-14: qty 50

## 2020-09-14 NOTE — H&P (Signed)
[name], MD [address]  [place]  [place], [place] [address]  Main: [phone]  Fax: [phone] Pager: [phone]  Primary Care Physician:  [name], MD Primary Gastroenterologist:  Dr. [name]  Pre-Procedure History & Physical: HPI:  Gabrielle Gutierrez is a 45 y.o. female is here for an endoscopy.   Past Medical History:  Diagnosis Date  . Spina bifida [facility])     Past Surgical History:  Procedure Laterality Date  . APPENDECTOMY    . CESAREAN SECTION     2  . CHOLECYSTECTOMY    . COLONOSCOPY WITH PROPOFOL N/A 04/05/2020   Procedure: COLONOSCOPY WITH PROPOFOL;  Surgeon: [name], MD;  Location: [facility] ENDOSCOPY;  Service: Gastroenterology;  Laterality: N/A;    Prior to Admission medications   Medication Sig Start Date End Date Taking? Authorizing Provider  gabapentin (NEURONTIN) 100 MG capsule gabapentin 100 mg capsule Patient not taking: Reported on 09/14/2020    [provider]  ketoconazole (NIZORAL) 2 % cream Apply topically. Patient not taking: Reported on 09/14/2020 08/07/19   [provider]  Levonorgestrel-Ethinyl Estradiol (AMETHIA) 0.15-0.03 &0.01 MG tablet Take 1 tablet by mouth at bedtime. Patient not taking: Reported on 09/14/2020 02/18/20   Harlin Heys, MD  meloxicam (MOBIC) 15 MG tablet Take 15 mg by mouth daily. Patient not taking: Reported on 09/14/2020 11/18/19   [provider]  omeprazole (PRILOSEC) 20 MG capsule Take 1 capsule (20 mg total) by mouth 2 (two) times daily before a meal. Patient not taking: Reported on 09/14/2020 03/24/20   [name], MD    Allergies as of 09/01/2020  . (No Known Allergies)    Family History  Problem Relation Age of Onset  . Cancer Mother   . Breast cancer Mother   . Rheum arthritis Father     Social History   Socioeconomic History  . Marital status: Married    Spouse name: Not on file  . Number of children: Not on file   . Years of education: Not on file  . Highest education level: Not on file  Occupational History  . Not on file  Tobacco Use  . Smoking status: Never Smoker  . Smokeless tobacco: Never Used  Vaping Use  . Vaping Use: Never used  Substance and Sexual Activity  . Alcohol use: Not Currently  . Drug use: Never  . Sexual activity: Not on file  Other Topics Concern  . Not on file  Social History Narrative  . Not on file   Social Determinants of Health   Financial Resource Strain: Not on file  Food Insecurity: Not on file  Transportation Needs: Not on file  Physical Activity: Not on file  Stress: Not on file  Social Connections: Not on file  Intimate Partner Violence: Not on file    Review of Systems: See HPI, otherwise negative ROS  Physical Exam: BP 116/76   Pulse 63   Temp (!) 96.8 F (36 C) (Temporal)   Resp 16   Ht 5\' 9"  (1.753 m)   Wt 93.4 kg   LMP  (Exact Date)   SpO2 100%   BMI 30.42 kg/m  General:   Alert,  pleasant and cooperative in NAD Head:  Normocephalic and atraumatic. Neck:  Supple; no masses or thyromegaly. Lungs:  Clear throughout to auscultation.    Heart:  Regular rate and rhythm. Abdomen:  Soft, nontender and nondistended. Normal bowel sounds, without guarding, and without rebound.  Neurologic:  Alert and  oriented x4;  grossly normal neurologically.  Impression/Plan: Gabrielle Gutierrez is here for an endoscopy to be performed for RUQ pain  Risks, benefits, limitations, and alternatives regarding  endoscopy have been reviewed with the patient.  Questions have been answered.  All parties agreeable.   Sherri Sear, MD  09/14/2020, 9:43 AM

## 2020-09-14 NOTE — Op Note (Signed)
[facility] Gastroenterology Patient Name: Gabrielle Gutierrez Procedure Date: 09/14/2020 10:10 AM MRN: [id number] Account #: [id number] Date of Birth: [birth year] Admit Type: Outpatient Age: 45 Room: [facility] ENDO ROOM 3 Gender: Female Note Status: Finalized Procedure:             Upper GI endoscopy Indications:           Abdominal pain in the right upper quadrant Providers:             [name] MD, MD Referring MD:          [name] Revelo (Referring MD) Medicines:             General Anesthesia Complications:         No immediate complications. Estimated blood loss: None. Procedure:             Pre-Anesthesia Assessment:                        - Prior to the procedure, a History and Physical was                         performed, and patient medications and allergies were                         reviewed. The patient is competent. The risks and                         benefits of the procedure and the sedation options and                         risks were discussed with the patient. All questions                         were answered and informed consent was obtained.                         Patient identification and proposed procedure were                         verified by the physician, the nurse, the                         anesthesiologist, the anesthetist and the technician                         in the pre-procedure area in the procedure room in the                         endoscopy suite. Mental Status Examination: alert and                         oriented. Airway Examination: normal oropharyngeal                         airway and neck mobility. Respiratory Examination:                         clear to auscultation. CV Examination: normal.  Prophylactic Antibiotics: The patient does not require                         prophylactic antibiotics. Prior Anticoagulants: The                         patient has taken no  previous anticoagulant or                         antiplatelet agents. ASA Grade Assessment: II - A                         patient with mild systemic disease. After reviewing                         the risks and benefits, the patient was deemed in                         satisfactory condition to undergo the procedure. The                         anesthesia plan was to use general anesthesia.                         Immediately prior to administration of medications,                         the patient was re-assessed for adequacy to receive                         sedatives. The heart rate, respiratory rate, oxygen                         saturations, blood pressure, adequacy of pulmonary                         ventilation, and response to care were monitored                         throughout the procedure. The physical status of the                         patient was re-assessed after the procedure.                        After obtaining informed consent, the endoscope was                         passed under direct vision. Throughout the procedure,                         the patient's blood pressure, pulse, and oxygen                         saturations were monitored continuously. The Endoscope                         was introduced through the mouth, and advanced to the  second part of duodenum. The upper GI endoscopy was                         accomplished without difficulty. The patient tolerated                         the procedure well. Findings:      The examined duodenum was normal. Biopsies were taken with a cold       forceps for histology.      The entire examined stomach was normal. Biopsies were taken with a cold       forceps for Helicobacter pylori testing.      The cardia and gastric fundus were normal on retroflexion.      Esophagogastric landmarks were identified: the gastroesophageal junction       was found at 36 cm from the  incisors.      The gastroesophageal junction and examined esophagus were normal. Impression:            - Normal examined duodenum. Biopsied.                        - Normal stomach. Biopsied.                        - Esophagogastric landmarks identified.                        - Normal gastroesophageal junction and esophagus. Recommendation:        - Discharge patient to home (with escort).                        - Resume previous diet today.                        - Continue present medications.                        - Await pathology results. Procedure Code(s):     --- Professional ---                        [id number], Esophagogastroduodenoscopy, flexible,                         transoral; with biopsy, single or multiple Diagnosis Code(s):     --- Professional ---                        R10.11, Right upper quadrant pain CPT copyright 2019 American Medical Association. All rights reserved. The codes documented in this report are preliminary and upon coder review may  be revised to meet current compliance requirements. Dr. Ulyess Mort [name] MD, MD 09/14/2020 60:45:40 AM This report has been signed electronically. Number of Addenda: 0 Note Initiated On: 09/14/2020 10:10 AM Estimated Blood Loss:  Estimated blood loss: none.      [facility]

## 2020-09-14 NOTE — Anesthesia Preprocedure Evaluation (Signed)
Anesthesia Evaluation  Patient identified by MRN, date of birth, ID band Patient awake    Reviewed: Allergy & Precautions, H&P , NPO status , Patient's Chart, lab work & pertinent test results, reviewed documented beta blocker date and time   Airway Mallampati: II   Neck ROM: full    Dental  (+) Teeth Intact   Pulmonary neg pulmonary ROS,    Pulmonary exam normal        Cardiovascular negative cardio ROS Normal cardiovascular exam Rhythm:regular Rate:Normal     Neuro/Psych negative neurological ROS  negative psych ROS   GI/Hepatic negative GI ROS, Neg liver ROS,   Endo/Other  negative endocrine ROS  Renal/GU negative Renal ROS  negative genitourinary   Musculoskeletal   Abdominal   Peds  Hematology negative hematology ROS (+)   Anesthesia Other Findings Past Medical History: No date: Spina bifida ([place]) Past Surgical History: No date: APPENDECTOMY No date: CESAREAN SECTION     Comment:  2 No date: CHOLECYSTECTOMY 04/05/2020: COLONOSCOPY WITH PROPOFOL; N/A     Comment:  Procedure: COLONOSCOPY WITH PROPOFOL;  Surgeon: [name], MD;  Location: [facility] ENDOSCOPY;  Service:               Gastroenterology;  Laterality: N/A; BMI    Body Mass Index: 30.42 kg/m     Reproductive/Obstetrics negative OB ROS                             Anesthesia Physical Anesthesia Plan  ASA: II  Anesthesia Plan: General   Post-op Pain Management:    Induction:   PONV Risk Score and Plan:   Airway Management Planned:   Additional Equipment:   Intra-op Plan:   Post-operative Plan:   Informed Consent: I have reviewed the patients History and Physical, chart, labs and discussed the procedure including the risks, benefits and alternatives for the proposed anesthesia with the patient or authorized representative who has indicated his/her understanding and acceptance.      Dental Advisory Given  Plan Discussed with: CRNA  Anesthesia Plan Comments:         Anesthesia Quick Evaluation

## 2020-09-14 NOTE — Anesthesia Procedure Notes (Signed)
Performed by: Cook-Martin, Rayder Sullenger Pre-anesthesia Checklist: Patient identified, Emergency Drugs available, Suction available, Patient being monitored and Timeout performed Patient Re-evaluated:Patient Re-evaluated prior to induction Oxygen Delivery Method: Nasal cannula Preoxygenation: Pre-oxygenation with 100% oxygen Induction Type: IV induction Airway Equipment and Method: Bite block Placement Confirmation: positive ETCO2 and CO2 detector       

## 2020-09-14 NOTE — Transfer of Care (Signed)
Immediate Anesthesia Transfer of Care Note  Patient: Gabrielle Gutierrez  Procedure(s) Performed: ESOPHAGOGASTRODUODENOSCOPY (EGD) WITH PROPOFOL (N/A )  Patient Location: PACU  Anesthesia Type:General  Level of Consciousness: awake and sedated  Airway & Oxygen Therapy: Patient Spontanous Breathing and Patient connected to nasal cannula oxygen  Post-op Assessment: Report given to RN and Post -op Vital signs reviewed and stable  Post vital signs: Reviewed and stable  Last Vitals:  Vitals Value Taken Time  BP    Temp    Pulse    Resp    SpO2      Last Pain:  Vitals:   09/14/20 0926  TempSrc: Temporal  PainSc: 0-No pain         Complications: No complications documented.

## 2020-09-15 ENCOUNTER — Encounter: Payer: Self-pay | Admitting: Gastroenterology

## 2020-09-15 LAB — SURGICAL PATHOLOGY

## 2020-09-16 ENCOUNTER — Telehealth: Payer: Self-pay

## 2020-09-16 NOTE — Anesthesia Postprocedure Evaluation (Signed)
Anesthesia Post Note  Patient: Shaquel Josephson  Procedure(s) Performed: ESOPHAGOGASTRODUODENOSCOPY (EGD) WITH PROPOFOL (N/A )  Patient location during evaluation: PACU Anesthesia Type: General Level of consciousness: awake and alert Pain management: pain level controlled Vital Signs Assessment: post-procedure vital signs reviewed and stable Respiratory status: spontaneous breathing, nonlabored ventilation, respiratory function stable and patient connected to nasal cannula oxygen Cardiovascular status: blood pressure returned to baseline and stable Postop Assessment: no apparent nausea or vomiting Anesthetic complications: no   No complications documented.   Last Vitals:  Vitals:   09/14/20 1050 09/14/20 1100  BP: 112/70 116/83  Pulse: 67 66  Resp: (!) 22 15  Temp:    SpO2: 100% 100%    Last Pain:  Vitals:   09/14/20 1100  TempSrc:   PainSc: 0-No pain                 Molli Barrows

## 2020-09-16 NOTE — Telephone Encounter (Signed)
Used interpreter services and patient verbalized understanding  

## 2020-09-16 NOTE — Telephone Encounter (Signed)
-----   Message from [name], MD sent at 09/15/2020  4:54 PM EDT ----- Gabrielle Gutierrez  Please inform patient that the pathology results from upper endoscopy came back normal  RV

## 2020-09-22 ENCOUNTER — Emergency Department
Admission: EM | Admit: 2020-09-22 | Discharge: 2020-09-22 | Disposition: A | Discharge status: Home / Self Care | Payer: Medicaid Other | Attending: Student in an Organized Health Care Education/Training Program | Admitting: Student in an Organized Health Care Education/Training Program

## 2020-09-22 ENCOUNTER — Emergency Department: Payer: Medicaid Other

## 2020-09-22 ENCOUNTER — Other Ambulatory Visit: Payer: Self-pay

## 2020-09-22 DIAGNOSIS — R059 Cough, unspecified: Secondary | ICD-10-CM | POA: Diagnosis present

## 2020-09-22 DIAGNOSIS — U071 COVID-19: Secondary | ICD-10-CM | POA: Insufficient documentation

## 2020-09-22 LAB — RESP PANEL BY RT-PCR (FLU A&B, COVID) ARPGX2
Influenza A by PCR: NEGATIVE
Influenza B by PCR: NEGATIVE
SARS Coronavirus 2 by RT PCR: POSITIVE — AB

## 2020-09-22 NOTE — ED Notes (Signed)
Pt verbalized understanding of d/c instructions at this time. Pt given opportunity to ask questions as needed. Pt ambulatory to ED lobby, NAD noted, RR even and unlabored, steady gait noted.

## 2020-09-22 NOTE — ED Provider Notes (Signed)
[facility]-EMERGENCY DEPARTMENT  ____________________________________________  Time seen: Approximately 8:06 PM  I have reviewed the triage vital signs and the nursing notes.   HISTORY  Chief Complaint covid sx   Historian Patient     HPI Gabrielle Gutierrez is a 45 y.o. female presents to the emergency department with viral URI-like symptoms that started on Monday.  Patient has low-grade fever, congestion, cough and body aches.  She reports that she is in close contact with many of her coworkers during the day, local [employer] store.  She denies chest pain, chest tightness or abdominal pain.   Past Medical History:  Diagnosis Date  . Spina bifida ([place])      Immunizations up to date:  Yes.     Past Medical History:  Diagnosis Date  . Spina bifida [facility])     Patient Active Problem List   Diagnosis Date Noted  . Abdominal pain, chronic, right upper quadrant   . 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 Halitosis 12/04/2018    Past Surgical History:  Procedure Laterality Date  . APPENDECTOMY    . CESAREAN SECTION     2  . CHOLECYSTECTOMY    . COLONOSCOPY WITH PROPOFOL N/A 04/05/2020   Procedure: COLONOSCOPY WITH PROPOFOL;  Surgeon: [name], MD;  Location[facility] ENDOSCOPY;  Service: Gastroenterology;  Laterality: N/A;  . ESOPHAGOGASTRODUODENOSCOPY (EGD) WITH PROPOFOL N/A 09/14/2020   Procedure: ESOPHAGOGASTRODUODENOSCOPY (EGD) WITH PROPOFOL;  Surgeon: [name], MD;  Location: [facility] ENDOSCOPY;  Service: Gastroenterology;  Laterality: N/A;    Prior to Admission medications   Not on File    Allergies Patient has no known allergies.  Family History  Problem Relation Age of Onset  . Cancer Mother   . Breast cancer Mother   . Rheum arthritis Father     Social History Social History   Tobacco Use  . Smoking status: Never Smoker  . Smokeless tobacco: Never Used  Vaping Use  . Vaping Use: Never used  Substance Use Topics  . Alcohol use: Not  Currently  . Drug use: Never      Review of Systems  Constitutional: Patient has fever.  Eyes: No visual changes. No discharge ENT: Patient has congestion.  Cardiovascular: no chest pain. Respiratory: Patient has cough.  Gastrointestinal: No abdominal pain.  No nausea, no vomiting. Patient had diarrhea.  Genitourinary: Negative for dysuria. No hematuria Musculoskeletal: Patient has myalgias.  Skin: Negative for rash, abrasions, lacerations, ecchymosis. Neurological: Patient has headache, no focal weakness or numbness.       ____________________________________________   PHYSICAL EXAM:  VITAL SIGNS: ED Triage Vitals  Enc Vitals Group     BP 09/22/20 1602 117/79     Pulse Rate 09/22/20 1602 (!) 110     Resp 09/22/20 1602 20     Temp 09/22/20 1602 100 F (37.8 C)     Temp Source 09/22/20 1602 Oral     SpO2 09/22/20 1602 92 %     Weight 09/22/20 1603 206 lb (93.4 kg)     Height 09/22/20 1603 5\' 9"  (1.753 m)     Head Circumference --      Peak Flow --      Pain Score 09/22/20 1602 8     Pain Loc --      Pain Edu? --      Excl. in [place]? --     Constitutional: Alert and oriented. Patient is lying supine. Eyes: Conjunctivae are normal. PERRL. EOMI. Head: Atraumatic. ENT:  Ears: Tympanic membranes are mildly injected with mild effusion bilaterally.       Nose: No congestion/rhinnorhea.      Mouth/Throat: Mucous membranes are moist. Posterior pharynx is mildly erythematous.  Hematological/Lymphatic/Immunilogical: No cervical lymphadenopathy.  Cardiovascular: Normal rate, regular rhythm. Normal S1 and S2.  Good peripheral circulation. Respiratory: Normal respiratory effort without tachypnea or retractions. Lungs CTAB. Good air entry to the bases with no decreased or absent breath sounds. Gastrointestinal: Bowel sounds 4 quadrants. Soft and nontender to palpation. No guarding or rigidity. No palpable masses. No distention. No CVA tenderness. Musculoskeletal: Full  range of motion to all extremities. No gross deformities appreciated. Neurologic:  Normal speech and language. No gross focal neurologic deficits are appreciated.  Skin:  Skin is warm, dry and intact. No rash noted. Psychiatric: Mood and affect are normal. Speech and behavior are normal. Patient exhibits appropriate insight and judgement.    ____________________________________________   LABS (all labs ordered are listed, but only abnormal results are displayed)  Labs Reviewed  RESP PANEL BY RT-PCR (FLU A&B, COVID) ARPGX2 - Abnormal; Notable for the following components:      Result Value   SARS Coronavirus 2 by RT PCR POSITIVE (*)    All other components within normal limits   ____________________________________________  EKG   ____________________________________________  RADIOLOGY Unk Pinto, personally viewed and evaluated these images (plain radiographs) as part of my medical decision making, as well as reviewing the written report by the radiologist.  DG Chest 1 View  Result Date: 09/22/2020 CLINICAL DATA:  Congestion and chills. EXAM: CHEST  1 VIEW COMPARISON:  PA and lateral chest 02/16/2020. FINDINGS: The lungs clear. Heart size normal. No pneumothorax or pleural fluid. No acute or focal bony abnormality. IMPRESSION: Negative chest. Electronically Signed   By: Inge Rise M.D.   On: 09/22/2020 18:49    ____________________________________________    PROCEDURES  Procedure(s) performed:     Procedures     Medications - No data to display   ____________________________________________   INITIAL IMPRESSION / ASSESSMENT AND PLAN / ED COURSE  Pertinent labs & imaging results that were available during my care of the patient were reviewed by me and considered in my medical decision making (see chart for details).      Assessment and plan COVID-20 45 year old female presents to the emergency department with viral URI-like symptoms that started  Monday.  Patient had a positive at-home COVID-19 test and a positive PCR COVID-19 test in the emergency department today.  Recommended rest and hydration at home.  Tylenol and ibuprofen were recommended alternating for fever and body aches.  Return precautions were given to return with chest pain, chest tightness or shortness of breath.  All patient questions were answered     ____________________________________________  FINAL CLINICAL IMPRESSION(S) / ED DIAGNOSES  Final diagnoses:  COVID-19      NEW MEDICATIONS STARTED DURING THIS VISIT:  ED Discharge Orders    None          This chart was dictated using voice recognition software/Dragon. Despite best efforts to proofread, errors can occur which can change the meaning. Any change was purely unintentional.     Karren Cobble 09/22/20 2010    Merlyn Lot, MD 09/22/20 2102

## 2020-09-22 NOTE — ED Triage Notes (Signed)
Pt to ED POV for congestion and chills, states took at home COVID test and tested neg but wants to make sure she is not positive.

## 2020-09-22 NOTE — Discharge Instructions (Addendum)
Take Tylenol and ibuprofen alternating for fever and body aches. Please stay quarantine at home until COVID-19 results return.

## 2020-09-24 ENCOUNTER — Telehealth: Payer: Self-pay

## 2020-09-24 NOTE — Telephone Encounter (Signed)
Called to discuss with patient about COVID-19 symptoms and the use of one of the available treatments for those with mild to moderate Covid symptoms and at a high risk of hospitalization.  Pt appears to qualify for outpatient treatment due to co-morbid conditions and/or a member of an at-risk group in accordance with the FDA Emergency Use Authorization.    Symptom onset: 09/20/20 Cough,fever,body aches Vaccinated: Yes Booster? Yes Immunocompromised? No Qualifiers: Spina Bifida NIH Criteria: Tier 4  Unable to reach pt - Left message and call back number [phone].   Marcello Moores

## 2020-10-04 ENCOUNTER — Ambulatory Visit: Payer: Medicaid Other | Admitting: Gastroenterology

## 2021-02-21 NOTE — Patient Instructions (Signed)
Autoexamen de [employer] Breast Self-Awareness El autoexamen de mamas es para Civil engineer, contracting la apariencia y la sensibilidad de las [place]. Es importante autoexaminarse las [place]. Permite detectar un problema en las mamas a tiempo mientras todava es pequeo y puede tratarse. Todas las mujeres deben autoexaminarse las [place], incluso aquellas que se sometieron a implantes mamarios. Informe al mdico si advierte un cambio en las mamas. Lo que necesita: Un espejo. Una habitacin bien iluminada. Cmo realizar el autoexamen de mamas El autoexamen de [place] es una forma de aprender qu es normal para sus mamas y revisar si hay cambios. Para hacer un autoexamen de las mamas: Busque cambios  Qutese toda la ropa por encima de la cintura. Prese frente a un espejo en una habitacin con buena iluminacin. Apoye las [employer] caderas. Empuje hacia abajo con las manos. Mrese las [employer] y los pezones en el espejo para ver si una mama o un pezn se ve diferente del otro. Durante el examen, intente determinar si: La forma de una mama es diferente. El tamao de una mama es diferente. Hay arrugas, depresiones y protuberancias en [place] y no en la otra. Observe cada mama para buscar cambios en la piel, por ejemplo: Enrojecimiento. Zonas escamosas. Observe si hay cambios en los pezones, por ejemplo: Lquido alrededor [employer]. Sangrado. Hoyuelos. Enrojecimiento. Un cambio en el lugar de los pezones. [place] si hay cambios  Acustese en el piso boca arriba. Plpese cada mama. Para hacerlo, siga estos pasos: Elija una mama para palpar. Coloque el brazo ms cercano a esa mama por encima de la cabeza. Use el otro brazo para palpar la zona del pezn de la mama. Plpese la zona con las yemas de los tres dedos del medio y haga crculos con los dedos. Con el primer crculo, presione suavemente. Con el segundo, ms fuerte. Con el tercero, an ms fuerte. Siga haciendo crculos con los dedos con las diferentes  presiones a medida que desciende por la mama. Detngase cuando sienta las costillas. Desplace los dedos un poco hacia el centro del cuerpo. Empiece a hacer crculos con los dedos nuevamente y esta vez haga movimientos ascendentes hasta llegar a la clavcula. Siga haciendo crculos [place] y [place] llegar a la axila. Recuerde hacerlos con las tres presiones. Plpese la otra mama de la misma forma. Sintese o prese en la ducha o la baera. Con agua jabonosa en la piel, plpese cada mama del mismo modo que lo hizo en el paso 2 mientras estaba acostada en el piso. Anote sus hallazgos Anotar lo que encuentra puede ayudarla a recordar qu contarle al mdico. [place] los siguientes datos: Qu es normal para cada mama. Cualquier cambio que encuentre en cada mama, por ejemplo: La clase de cambios que encuentra. Si tiene dolor. Si hay bultos, su tamao y [place]. Cundo tuvo su ltimo perodo menstrual. Consejos generales [facility]. Si est amamantando, el mejor momento para el examen de las mamas es despus de darle de Administrator, arts al beb o despus de usar un sacaleches. Si tiene perodos menstruales, el mejor momento para Nature conservation officer es de 5 a 7 das despus de la finalizado el perodo menstrual. Con el [place], se sentir cmoda con el autoexamen y Medical laboratory scientific officer a saber si hay cambios en sus mamas. Comunquese con un mdico si: Observa un cambio en la forma o el tamao de las mamas o los pezones. Observa un cambio en la piel de las mamas o los pezones, como la  piel enrojecida o escamosa. Tiene secrecin de lquido proveniente de los pezones que no es normal. [place] un ndulo o una zona engrosada que no tena antes. Tiene dolor en las [place]. Tiene alguna inquietud [employer] de la [place]. Resumen El autoexamen de mamas incluye buscar cambios en las [place], y tambin palpar para Hydrographic surveyor cualquier cambio en las [place]. El autoexamen de mamas debe hacerse frente a  un espejo en una habitacin bien iluminada. Debe revisarse las [employer]. Si tiene perodos menstruales, el mejor momento para hacerlo es de 5 a 7 das despus de la finalizado el perodo menstrual. Informe al mdico si ve cambios en las mamas, como cambios en el tamao, cambios en la piel, dolor o sensibilidad, o un lquido inusual que sale de los pezones. Esta informacin no tiene Marine scientist el consejo del mdico. Asegrese de hacerle al mdico cualquier pregunta que tenga. Document Revised: 02/05/2018 Document Reviewed: 02/05/2018 Elsevier Patient Education  2022 [place] preventivos en las mujeres de 79 a 79 aos de edad Preventive Care 11-42 Years Old, Female Los cuidados preventivos hacen referencia a las opciones en cuanto al estilo de vida y a las visitas al mdico, las cuales pueden promover la salud y Musician. Esto puede comprender lo siguiente: Un examen fsico anual. Esto tambin se conoce como visita de control de bienestar anual. Exmenes dentales y oculares de [place] regular. Vacunas. Estudios para Health and safety inspector. Elecciones para un estilo de vida saludable, por ejemplo: Seguir una dieta saludable. Practicar actividad fsica con regularidad. No consumir drogas ni productos que contengan nicotina y tabaco. Limitar el consumo de bebidas alcohlicas. Qu puedo esperar para mi visita de cuidado preventivo? Examen fsico El mdico revisar lo siguiente: IT consultant y [place]. Estos pueden usarse para calcular el IMC (ndice de masa corporal). El [facility] es una medicin que indica si tiene un peso saludable. Frecuencia cardaca y presin arterial. Temperatura corporal. Piel para detectar manchas anormales. Asesoramiento Su mdico puede preguntarle acerca de: Problemas mdicos pasados. Antecedentes mdicos familiares. Consumo de tabaco, alcohol y drogas. Su bienestar emocional. Restaurant manager, fast food y las relaciones  personales. Su actividad sexual. Hbitos de alimentacin, ejercicio y sueo. Su trabajo y [place] laboral. Acceso a armas de fuego. Mtodos anticonceptivos. Ciclo menstrual. Antecedentes de embarazo. Qu vacunas necesito? Las vacunas se aplican a varias edades, segn un calendario. El [employer] recomendar vacunas segn su edad, sus antecedentes mdicos, su estilo de vida y otros factores, como los viajes o el lugar donde trabaja. Qu pruebas necesito? Anlisis de [place] de lpidos y colesterol. Estos se pueden verificar cada 5 aos, o ms a menudo, si usted tiene ms de 81 aos de edad. Anlisis de hepatitis C. Anlisis de hepatitis B. Pruebas de deteccin Pruebas de deteccin de cncer de pulmn. Es posible que se le realice esta prueba de deteccin a partir de los 60 aos de edad, si ha fumado durante 30 aos un paquete diario y sigue fumando o dej el hbito en algn momento en los ltimos 15 aos. Pruebas de Programme researcher, broadcasting/film/video de Surveyor, minerals. Todos los adultos a partir de los 37 aos de edad y [place] 69 aos de edad deben hacerse esta prueba de deteccin. El mdico puede recomendarle las pruebas de deteccin a partir de los 21 aos de edad si corre un mayor riesgo. Le realizarn pruebas cada 1 a 10 aos, segn los [place] y el tipo de prueba de Programme researcher, broadcasting/film/video. Pruebas de deteccin de  la diabetes. Esto se Set designer un control del azcar en la sangre (glucosa) despus de no haber comido durante un periodo de tiempo (ayuno). Es posible que se le realice esta prueba cada 1 a 3 aos. Mamografa. Se puede realizar cada 1 o 2 aos. Hable con su mdico sobre cundo debe comenzar a Engineer, manufacturing de [place] regular. Esto depende de si tiene antecedentes familiares de cncer de mama o no. Pruebas de deteccin de cncer relacionado con las mutaciones del BRCA. Es posible que se las deba realizar si tiene antecedentes de cncer de mama, de ovario, de trompas o  peritoneal. Examen plvico y prueba de Papanicolaou. Esto se puede Optometrist cada 3 aos a partir de los 21 aos de [place]. A partir de los 30 aos, esto se puede Optometrist cada 5 aos si usted se realiza una prueba de Papanicolaou en combinacin con una prueba de deteccin del virus del papiloma humano (VPH). Otras pruebas Pruebas de enfermedades de transmisin sexual (ETS), si est en riesgo. Densitometra sea. Esto se realiza para detectar osteoporosis. Se le puede realizar este examen de deteccin si tiene un riesgo alto de tener osteoporosis. Hable con su mdico [employer], las opciones de tratamiento y, si corresponde, la necesidad de Optometrist ms pruebas. Siga estas instrucciones en su casa: Comida y bebida  Siga una dieta que incluya frutas y verduras frescas, cereales integrales, protenas magras y productos lcteos descremados. Tome los suplementos vitamnicos y [employer] se lo haya indicado el mdico. No beba alcohol si: Su mdico le indica no hacerlo. Est embarazada, puede estar embarazada o est tratando de [place]. Si bebe alcohol: Limite la cantidad que consume de 0 a 1 medida por da. Est atenta a la cantidad de alcohol que hay en las bebidas que toma. En los [place], una medida equivale a una botella de cerveza de 12 oz (355 ml), un vaso de vino de 5 oz (148 ml) o un vaso de una bebida alcohlica de alta graduacin de 1 oz (44 ml). Estilo de [employer] y las encas a diario. Cepllese los dientes a la maana y a la noche con pasta dental con fluoruro. Use hilo dental una vez al da. Mantngase activa. Haga al menos 30 minutos de ejercicio, 5 o ms das cada semana. No consuma ningn producto que contenga nicotina o tabaco, como cigarrillos, cigarrillos electrnicos y tabaco de Higher education careers adviser. Si necesita ayuda para dejar de fumar, consulte al mdico. No consuma drogas. Si es sexualmente activa, practique sexo seguro. Use un  condn u otra forma de proteccin para prevenir las ITS (infecciones de transmisin sexual). Si no desea quedar embarazada, use un mtodo anticonceptivo. Si busca un embarazo, realice una consulta previa al [employer] con el mdico. Si el mdico se lo indic, tome una dosis baja de aspirina diariamente a partir de los 77 aos de [place]. Encuentre formas saludables de lidiar con el estrs tales como: Meditacin, yoga o Conservation officer, nature. Lleve un diario personal. Hable con una persona confiable. Pase tiempo con amigos y familiares. Seguridad [place] siempre el cinturn de seguridad al conducir o viajar en un vehculo. No conduzca: Si ha estado bebiendo alcohol. No viaje con un conductor que ha estado bebiendo. Si est cansada o distrada. Mientras est enviando mensajes de texto. Use un casco y otros equipos de proteccin [place] deportivas. Si tiene armas de fuego en su casa, asegrese de seguir todos los procedimientos de seguridad correspondientes. Cundo volver? Visite  al mdico una vez al ao para una visita anual de control de bienestar. Pregntele al mdico con qu frecuencia debe realizarse un control de la vista y los dientes. Mantenga su esquema de vacunacin al da. Esta informacin no tiene Marine scientist el consejo del mdico. Asegrese de hacerle al mdico cualquier pregunta que tenga. Document Revised: 03/10/2020 Document Reviewed: 03/10/2020 Elsevier Patient Education  2022 [employer].

## 2021-02-22 ENCOUNTER — Ambulatory Visit (INDEPENDENT_AMBULATORY_CARE_PROVIDER_SITE_OTHER): Payer: Medicaid Other | Admitting: Obstetrics and Gynecology

## 2021-02-22 ENCOUNTER — Other Ambulatory Visit: Payer: Self-pay

## 2021-02-22 ENCOUNTER — Encounter: Payer: Self-pay | Admitting: Obstetrics and Gynecology

## 2021-02-22 VITALS — BP 110/69 | HR 76 | Ht 69.0 in | Wt 216.2 lb

## 2021-02-22 DIAGNOSIS — Z01419 Encounter for gynecological examination (general) (routine) without abnormal findings: Secondary | ICD-10-CM | POA: Diagnosis not present

## 2021-02-22 NOTE — Progress Notes (Signed)
Pt present for routine annual exam. Pt stated having right side pain. Pain level 8-9.

## 2021-02-22 NOTE — Progress Notes (Signed)
HPI:      Gabrielle Gutierrez is a 45 y.o. [id number] who LMP was Patient's last menstrual period was 02/14/2021.  Subjective:   She presents today for her annual examination.  She states that she is having right upper quadrant pain that has been present for approximately 1 year.  She is in the middle of a work-up for this pain.  She feels as if her liver enzymes have been found to be elevated.  She has undergone gastric biopsy as well as colonoscopy without any particular findings.  She does not have a gallbladder. She has a follow-up appointment with her primary care doctor in the next month to further discuss this issue. The important GYN issue is that the patient stopped her OCPs because of their liver metabolism.  She was on OCPs because she is in premature menopause.  She was not noticing a significant difference when on them or when not on them.  She has plans to discuss this matter further with her PCP at the next visit. She has a remote history of cervical cryo.  Her last Pap smear was normal.    Hx: The following portions of the patient's history were reviewed and updated as appropriate:             She  has a past medical history of Spina bifida ([place]). She does not have any pertinent problems on file. She  has a past surgical history that includes Appendectomy; Cholecystectomy; Cesarean section; Colonoscopy with propofol (N/A, 04/05/2020); and Esophagogastroduodenoscopy (egd) with propofol (N/A, 09/14/2020). Her family history includes Breast cancer in her mother; Cancer in her mother; Rheum arthritis in her father. She  reports that she has never smoked. She has never used smokeless tobacco. She reports current alcohol use. She reports that she does not use drugs. She currently has no medications in their medication list. She has No Known Allergies.       Review of Systems:  Review of Systems  Constitutional: Denied constitutional symptoms, night sweats, recent illness, fatigue,  fever, insomnia and weight loss.  Eyes: Denied eye symptoms, eye pain, photophobia, vision change and visual disturbance.  Ears/Nose/Throat/Neck: Denied ear, nose, throat or neck symptoms, hearing loss, nasal discharge, sinus congestion and sore throat.  Cardiovascular: Denied cardiovascular symptoms, arrhythmia, chest pain/pressure, edema, exercise intolerance, orthopnea and palpitations.  Respiratory: Denied pulmonary symptoms, asthma, pleuritic pain, productive sputum, cough, dyspnea and wheezing.  Gastrointestinal: Denied, gastro-esophageal reflux, melena, nausea and vomiting.  Genitourinary: Denied genitourinary symptoms including symptomatic vaginal discharge, pelvic relaxation issues, and urinary complaints.  Musculoskeletal: Denied musculoskeletal symptoms, stiffness, swelling, muscle weakness and myalgia.  Dermatologic: Denied dermatology symptoms, rash and scar.  Neurologic: Denied neurology symptoms, dizziness, headache, neck pain and syncope.  Psychiatric: Denied psychiatric symptoms, anxiety and depression.  Endocrine: Denied endocrine symptoms including hot flashes and night sweats.   Meds:   No current outpatient medications on file prior to visit.   No current facility-administered medications on file prior to visit.     Objective:     Vitals:   02/22/21 0909  BP: 110/69  Pulse: 76    Filed Weights   02/22/21 0909  Weight: 216 lb 3.2 oz (98.1 kg)              Physical examination General NAD, Conversant  HEENT Atraumatic; Op clear with mmm.  Normo-cephalic. Pupils reactive. Anicteric sclerae  Thyroid/Neck Smooth without nodularity or enlargement. Normal ROM.  Neck Supple.  Skin No rashes, lesions or ulceration.  Normal palpated skin turgor. No nodularity.  Breasts: No masses or discharge.  Symmetric.  No axillary adenopathy.  Lungs: Clear to auscultation.No rales or wheezes. Normal Respiratory effort, no retractions.  Heart: NSR.  No murmurs or rubs  appreciated. No periferal edema  Abdomen: Soft.  Non-tender.  No masses.  No HSM. No hernia  Extremities: Moves all appropriately.  Normal ROM for age. No lymphadenopathy.  Neuro: Oriented to PPT.  Normal mood. Normal affect.     Pelvic:   Vulva: Normal appearance.  No lesions.  Vagina: No lesions or abnormalities noted.  Support: Normal pelvic support.  Urethra No masses tenderness or scarring.  Meatus Normal size without lesions or prolapse.  Cervix: Normal appearance.  No lesions.  Anus: Normal exam.  No lesions.  Perineum: Normal exam.  No lesions.        Bimanual   Uterus: Normal size.  Non-tender.  Mobile.  AV.  Adnexae: No masses.  Non-tender to palpation.  Cul-de-sac: Negative for abnormality.     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 Encounter for well woman exam with routine gynecological exam     Patient known to be in early menopause.  Stopped OCPs because of recent right upper quadrant pain/liver pain/elevated liver enzymes.  Work-up continues.   Plan:            1.  Basic Screening Recommendations The basic screening recommendations for asymptomatic women were discussed with the patient during her visit.  The age-appropriate recommendations were discussed with her and the rational for the tests reviewed.  When I am informed by the patient that another primary care physician has previously obtained the age-appropriate tests and they are up-to-date, only outstanding tests are ordered and referrals given as necessary.  Abnormal results of tests will be discussed with her when all of her results are completed.  Routine preventative health maintenance measures emphasized: Exercise/Diet/Weight control, Tobacco Warnings, Alcohol/Substance use risks and Stress Management Recommend Pap next year-mammogram ordered.  Lab work  through PCP. 2.  Patient to ask her PCP regarding future OCP use or HRT use.  At such a young age to be in menopause I believe hormones are warranted. She will get back in touch with [place] after she gets a definitive answer from her other doctor. Orders Orders Placed This Encounter  Procedures   MM DIGITAL SCREENING BILATERAL    No orders of the defined types were placed in this encounter.        F/U  Return in about 1 year (around 02/22/2022) for return in 1 year for annual exam. .  Finis Bud, M.D. 02/22/2021 10:04 AM

## 2021-03-16 ENCOUNTER — Encounter: Payer: Self-pay | Admitting: Nurse Practitioner

## 2021-03-22 ENCOUNTER — Ambulatory Visit
Admission: RE | Admit: 2021-03-22 | Discharge: 2021-03-22 | Disposition: A | Discharge status: Home / Self Care | Payer: Medicaid Other | Source: Ambulatory Visit | Attending: Family Medicine | Admitting: Family Medicine

## 2021-03-22 ENCOUNTER — Other Ambulatory Visit: Payer: Self-pay

## 2021-03-22 DIAGNOSIS — Z1231 Encounter for screening mammogram for malignant neoplasm of breast: Secondary | ICD-10-CM | POA: Diagnosis not present

## 2021-03-29 ENCOUNTER — Encounter: Payer: Self-pay | Admitting: Gastroenterology

## 2021-03-29 ENCOUNTER — Ambulatory Visit (INDEPENDENT_AMBULATORY_CARE_PROVIDER_SITE_OTHER): Payer: Medicaid Other | Admitting: Gastroenterology

## 2021-03-29 VITALS — BP 109/74 | HR 64 | Temp 97.7°F | Ht 69.0 in | Wt 214.8 lb

## 2021-03-29 DIAGNOSIS — R109 Unspecified abdominal pain: Secondary | ICD-10-CM | POA: Diagnosis not present

## 2021-03-29 NOTE — Progress Notes (Signed)
[name], MD [address]  [place]  [place], [place] [address]  Main: [phone]  Fax: [phone]    Gastroenterology Consultation  Referring Provider:     [name]* Primary Care Physician:  [name], MD Primary Gastroenterologist:  Dr. [name] Reason for Consultation:   Chronic right sided abdominal pain        HPI:   Gabrielle Gutierrez is a 45 y.o. female referred by Dr. [name], [name], MD  for consultation & management of 1 month history of right-sided abdominal pain, dull, severe in intensity, radiating to back associated with nausea, soft, nonbloody bowel movements.  She has history of cholecystectomy as well as appendectomy.  Patient reports that she modified her diet which provides some relief of her symptoms.  She denies carbonated beverages.  She stays home, not very active.  Patient also wanted to discuss about colonoscopy given her mother with history of several colon polyps.  She reports that her mom has been undergoing colonoscopy since age of 102 every 1 to 2 years and polyps have been found every time she undergoes colonoscopy.  Recent labs from 01/2020 including CBC, CMP were unremarkable Patient does not smoke or drink alcohol  Follow-up visit 08/02/2020 Patient is here for follow-up of chronic right upper quadrant pain.  She had right upper quadrant pain associated with nausea and loose stool, H. pylori breath test was negative.  Repeat LFTs came back normal.  Right upper quadrant ultrasound revealed fatty liver, status post cholecystectomy and no biliary dilatation.  She reports that for new year, she had few beers which were flavored, which resulted in worsening of right upper quadrant pain associated with loose stools.  She went to see her PCP, she was told that her lipase was elevated.  A week later, lipase was rechecked and it was still elevated.  She was told that it could be related to pancreas and was  advised to make an appointment to see GI.  I do not have access to those labs.  Patient reports ongoing right upper quadrant pain.  She denies loose stools at this time.  She denies any rectal bleeding, nausea or vomiting.  She does notice fatty foods worsening the pain.  She also has abdominal bloating.  She just started walking about a week ago at [place]. She stopped meloxicam as well as omeprazole  Follow-up visit 09/01/2020 Patient continues to have right upper quadrant pain.  She did not undergo upper endoscopy yet.  She tells me that the pain has been intermittent and more or less constant, particularly not related to food.  She points out that her pain to the right upper quadrant as well as to the right lower rib cage posteriorly.  Today, she also tells me that her pain is triggered after movement at work, is also experiencing pain in her both hips.  She is trying to lose weight, lost about 10 pounds watching her diet.   Follow-up visit 02/26/2021 Patient continues to have right-sided abdominal pain without any loose stools, abdominal bloating.  She underwent EGD including gastric and duodenal biopsies which were unremarkable.  No evidence of H. pylori.  The pain has no particular relation to food.  Patient reports that she is planning to undergo liposuction and tummy tuck in [place] and waiting for this pain to be better.  No weight loss.  She reports that she has been eating healthy  NSAIDs: None  Antiplts/Anticoagulants/Anti thrombotics:  None  GI Procedures:  Upper endoscopy 02/14/2021 - Normal examined duodenum. Biopsied. - Normal stomach. Biopsied. - Esophagogastric landmarks identified. - Normal gastroesophageal junction and esophagus.  DIAGNOSIS:  A. DUODENUM; COLD BIOPSY:  - ENTERIC MUCOSA WITH PRESERVED VILLOUS ARCHITECTURE AND NO SIGNIFICANT  HISTOPATHOLOGIC CHANGE.  - NEGATIVE FOR FEATURES OF CELIAC, DYSPLASIA, AND MALIGNANCY.   B. STOMACH; COLD BIOPSY:  - GASTRIC ANTRAL  AND OXYNTIC MUCOSA WITH NO SIGNIFICANT HISTOPATHOLOGIC  CHANGE.  - NEGATIVE FOR H. PYLORI, DYSPLASIA, AND MALIGNANCY.   Colonoscopy 04/05/2020 - The examined portion of the ileum was normal. - One 5 mm polyp in the transverse colon, removed with a cold snare. Resected and retrieved. - Normal mucosa in the entire examined colon. Biopsied. - The distal rectum and anal verge are normal on retroflexion view. DIAGNOSIS:  A.  COLON, RANDOM; COLD BIOPSY:  - BENIGN COLONIC MUCOSA WITH NO SIGNIFICANT HISTOPATHOLOGIC CHANGE.  - NEGATIVE FOR FEATURES OF MICROSCOPIC COLITIS.  - NEGATIVE FOR DYSPLASIA AND MALIGNANCY.   B. COLON POLYP, TRANSVERSE; COLD SNARE:  - SESSILE SERRATED POLYP.  - NEGATIVE FOR DYSPLASIA AND MALIGNANCY.  Mother with colon polyps  Past Medical History:  Diagnosis Date   Spina bifida [facility])     Past Surgical History:  Procedure Laterality Date   APPENDECTOMY     CESAREAN SECTION     2   CHOLECYSTECTOMY     COLONOSCOPY WITH PROPOFOL N/A 04/05/2020   Procedure: COLONOSCOPY WITH PROPOFOL;  Surgeon: [name], MD;  Location: [facility] ENDOSCOPY;  Service: Gastroenterology;  Laterality: N/A;   ESOPHAGOGASTRODUODENOSCOPY (EGD) WITH PROPOFOL N/A 09/14/2020   Procedure: ESOPHAGOGASTRODUODENOSCOPY (EGD) WITH PROPOFOL;  Surgeon: [name], MD;  [facility] ENDOSCOPY;  Service: Gastroenterology;  Laterality: N/A;   Current Outpatient Medications:    omeprazole (PRILOSEC) 20 MG capsule, Take 20 mg by mouth daily., Disp: , Rfl:     Family History  Problem Relation Age of Onset   Cancer Mother    Breast cancer Mother    Rheum arthritis Father      Social History   Tobacco Use   Smoking status: Never   Smokeless tobacco: Never  Vaping Use   Vaping Use: Never used  Substance Use Topics   Alcohol use: Yes   Drug use: Never    Allergies as of 03/29/2021   (No Known Allergies)    Review of Systems:    All systems reviewed and negative except  where noted in HPI.   Physical Exam:  BP 109/74 (BP Location: Left Arm, Patient Position: Sitting, Cuff Size: Normal)   Pulse 64   Temp 97.7 F (36.5 C) (Oral)   Ht 5\' 9"  (1.753 m)   Wt 214 lb 12.8 oz (97.4 kg)   BMI 31.72 kg/m  No LMP recorded. (Menstrual status: Other).  General:   Alert,  Well-developed, well-nourished, pleasant and cooperative in NAD Head:  Normocephalic and atraumatic. Eyes:  Sclera clear, no icterus.   Conjunctiva pink. Ears:  Normal auditory acuity. Nose:  No deformity, discharge, or lesions. Mouth:  No deformity or lesions,oropharynx pink & moist. Neck:  Supple; no masses or thyromegaly. Lungs:  Respirations even and unlabored.  Clear throughout to auscultation.   No wheezes, crackles, or rhonchi. No acute distress. Heart:  Regular rate and rhythm; no murmurs, clicks, rubs, or gallops. Abdomen:  Normal bowel sounds. Soft, mild right mid quadrant tenderness/right periumbilic area, moderately distended without masses, hepatosplenomegaly or hernias noted.  No guarding or rebound tenderness.   Rectal:  Not performed Msk:  Symmetrical without gross deformities. Good, equal movement & strength bilaterally. Pulses:  Normal pulses noted. Extremities:  No clubbing or edema.  No cyanosis. Neurologic:  Alert and oriented x3;  grossly normal neurologically. Skin:  Intact without significant lesions or rashes. No jaundice. Psych:  Alert and cooperative. Normal mood and affect.  Imaging Studies: Reviewed  Assessment and Plan:   Gabrielle Gutierrez is a 45 y.o. Spanish-speaking female with overweight, BMI 31.8, s/p cholecystectomy more than 3 years ago is seen in consultation for chronic history of right upper quadrant pain, abdominal bloating and intermittent loose stools.  LFTs were normal.  H. pylori breath test negative.  Colonoscopy was unremarkable, random colon biopsies are negative.  Right upper quadrant ultrasound revealed fatty liver only  Chronic right-sided  mid abdominal pain: Likely functional Repeat LFTs, CBC, lipase were unremarkable CT abdomen and pelvis was also unremarkable upper endoscopy with gastric and duodenal biopsies were unremarkable Patient is not experiencing diarrhea.  Trial of IBgard and also Xifaxan samples provided  Sessile serrated polyp less than 1 cm Recommend surveillance colonoscopy in 03/2027  Fatty liver Last LFTs were normal Patient reports that her LFTs were mildly elevated at her PCPs office in [place], I do not have access to those results Discussed about low-fat diet, information provided  Follow up as needed   [name], MD

## 2021-03-29 NOTE — Patient Instructions (Signed)
Plan de alimentacin cardiosaludable Heart-Healthy Eating Plan Muchos factores influyen en la salud del corazn (coronaria), entre ellos, los hbitos de alimentacin y de ejercicio fsico. El riesgo coronario aumenta cuando hay niveles anormales de grasa (lpidos) en la [place]. La planificacin de las comidas cardiosaludables implica limitar las grasas poco saludables, aumentar las grasas saludables y Field seismologist otros cambios en la dieta y el estilo de [place]. En qu consiste el plan? El mdico podra recomendarle que haga lo siguiente: Limitar la ingesta de grasas al _________ % o menos del total de caloras por da. Limitar la ingesta de grasas saturadas al _________ % o menos del total de caloras por da. Limitar la cantidad de colesterol en su dieta a menos de _________ mg por da. Cules son algunos consejos para seguir este plan? Al cocinar Evite frer los alimentos a la hora de la coccin. Hornear, hervir, grillar y asar a la parrilla son buenas opciones. Otras formas de reducir el consumo de grasas son las siguientes: Quite la piel de las aves. Quite todas las grasas visibles de las carnes. Cocine al vapor las verduras en agua o caldo. Planificacin de las comidas  En las comidas, imagine dividir su plato en cuartos: Llene la mitad del plato con verduras y ensaladas de hojas verdes. Llene un cuarto del plato con cereales integrales. Llene un cuarto del plato con alimentos con protenas magras. Coma 4 o 5 porciones de verduras por da. Una porcin equivale a una taza de verduras crudas o cocidas o a 2 tazas de verduras de hojas verdes crudas. Coma 4 o 5 porciones de frutas por da. Una porcin equivale a una fruta mediana entera,  taza de fruta desecada;  taza de frutas frescas, congeladas o enlatadas; o  taza de jugo 100 % de fruta. Consuma ms alimentos con fibra soluble. Entre ellos, se incluyen las [place], el [place], las [place], los frijoles, los guisantes y Nature conservation officer. Trate de  consumir de 25 a 30 g de [employer] Stores. Aumente el consumo de legumbres, frutos secos y semillas a 4 o 5 porciones por semana. Una porcin de frijoles o legumbres secos equivale a  taza despus de su coccin, una porcin de frutos secos equivale a  de taza y [place] porcin de semillas equivale a 1 cucharada. Grasas Elija grasas saludables con mayor frecuencia. Elija las grasas monoinsaturadas y poliinsaturadas, como el aceite de oliva y el de canola, las semillas de [place], las nueces, las almendras y las semillas. Consuma ms grasas omega-3. Elija salmn, caballa, sardinas, atn, aceite de lino y semillas de lino molidas. Propngase comer pescado al [employer] veces por semana. Lea las etiquetas de los alimentos detenidamente para identificar los que contienen grasas trans o altas cantidades de grasas saturadas. Limite el consumo de grasas saturadas. Estas se encuentran en productos de origen animal, como carnes, mantequilla y crema. Las grasas saturadas de origen vegetal incluyen aceite de palma, de palmiste y de coco. Evite los alimentos con aceites parcialmente hidrogenados. Estos contienen grasas trans. [address] [place], se incluyen margarinas en barra, algunas margarinas untables, galletas dulces y [place] y otros productos horneados. Evite las comidas fritas. Informacin general Consuma ms comida casera y menos de restaurante, de bares y comida rpida. Limite o evite el alcohol. Limite los alimentos con alto contenido de almidn y Location manager. Baje de peso si es necesario. Perder solo del 5 al 10 % de su peso corporal puede ayudarlo a mejorar su estado de salud general y a Producer, television/film/video,  como la diabetes y las enfermedades cardacas. Controle la ingesta de sal (sodio), especialmente si tiene presin arterial alta. Hable con el mdico acerca de cambiar la ingesta de sodio. Intente incorporar ms comidas vegetarianas cada semana. Qu alimentos puedo comer? Lambert Mody Lambert Mody frescas, en conserva  (en su jugo natural) o frutas congeladas. Verduras Verduras frescas o congeladas (crudas, al vapor, asadas o grilladas). Ensaladas de hojas verdes. Cereales La mayora de los cereales. Elija casi siempre trigo integral y Psychologist, prison and probation services. Arroz y pastas, incluido el arroz integral y las pastas elaboradas con trigo integral. Estate agent y otras protenas Carnes magras de res, ternera, cerdo y cordero a las que se les haya quitado la grasa visible. Pollo y pavo sin piel. Todos los pescados y [employer]. Pato salvaje, conejo, faisn y venado. Claras de huevo o sustitutos del huevo bajos en colesterol. Porotos, guisantes y lentejas secos y tofu. Semillas y la mayora de los frutos secos. Lcteos Quesos descremados y semidescremados, entre ellos, ricota y Artist. Leche descremada o al 1 % (lquida, en polvo o evaporada). [place]. Yogur descremado o semidescremado. Grasas y Naval architect no hidrogenadas (sin grasas trans). Aceites vegetales, incluido el de soja, ssamo, girasol, [place], man, crtamo, maz, canola y semillas de algodn. Alios para ensalada o mayonesa elaborados con aceite vegetal. Billie Lade (mineral o con gas). T y caf. Gaseosas dietticas. Dulces y [employer], gelatina y helado de frutas. Pequeas cantidades de chocolate amargo. Limite todos los dulces y postres. Alios y [employer] y condimentos. Es posible que los productos que se enumeran ms [place] no constituyan una lista completa de los alimentos y las bebidas que puede tomar. Consulte a un nutricionista para conocer ms opciones. Qu alimentos no se recomiendan? Lambert Mody Fruta enlatada en almbar espeso. Frutas con salsa de crema o mantequilla. Frutas cocidas en aceite. Limite el consumo de coco. Verduras Verduras cocinadas con salsas de queso, crema o mantequilla. Verduras fritas. Cereales Panes elaborados con grasas saturadas o trans, aceites o  [employer]. Croissants. Panecillos dulces. [place]. Galletas con alto contenido de grasas, como las que contienen [place]. Carnes y otras protenas Carnes grasas, como perros calientes, [place] de res, salchichas, tocino, asado de Engineer, structural o [place]. Fiambres con alto contenido de [place], como salame y Archivist. Caviar. Pato y ganso domsticos. Vsceras, como hgado. Lcteos Crema, crema agria, queso crema y [place] cottage con crema. Quesos enteros. Leche entera o al 2 % (lquida, evaporada o condensada). Suero de [employer]. Salsa de crema o queso con alto contenido de [place]. [place] Grasas y [place] de carne o materia grasa. [place] de cacao, aceites hidrogenados, aceite de palma, aceite de coco, aceite de palmiste. Grasas y materias grasas slidas, incluida la grasa del tocino, el cerdo [place], la [place] de cerdo y Community education officer. Sustitutos no lcteos de la crema. Aderezos para ensalada con queso o crema agria. Bebidas Refrescos regulares y cualquier bebida con agregado de azcar. Dulces y [employer]. Pudin. Galletas dulces. Bizcochuelos. Pasteles. Chocolate con leche o chocolate blanco. Almbares con mantequilla. Helados o bebidas elaboradas con helado con alto contenido de grasas. Es posible que los productos que se enumeran ms arriba no sean una lista completa de los alimentos y las bebidas que se Higher education careers adviser. Consulte a un nutricionista para obtener ms informacin. Resumen La planificacin de las comidas cardiosaludables implica limitar las grasas poco saludables, aumentar las grasas saludables y Field seismologist otros cambios en la dieta y [place] de  vidaKris Hartmann de peso si es necesario. Perder solo del 5 al 10 % de su peso corporal puede ayudarlo a mejorar su estado de salud general y a Producer, television/film/video, como la diabetes y las enfermedades cardacas. Propngase seguir una dieta equilibrada, que incluya frutas y verduras, productos lcteos descremados o  semidescremados, protenas magras, frutos secos y legumbres, cereales integrales y aceites y grasas cardiosaludables. Esta informacin no tiene Marine scientist el consejo del mdico. Asegrese de hacerle al mdico cualquier pregunta que tenga. Document Revised: 09/17/2020 Document Reviewed: 09/17/2020 Elsevier Patient Education  [place].

## 2021-04-05 ENCOUNTER — Other Ambulatory Visit: Payer: Self-pay

## 2021-04-05 ENCOUNTER — Encounter: Payer: Self-pay | Admitting: Obstetrics and Gynecology

## 2021-04-05 ENCOUNTER — Ambulatory Visit (INDEPENDENT_AMBULATORY_CARE_PROVIDER_SITE_OTHER): Payer: Medicaid Other | Admitting: Obstetrics and Gynecology

## 2021-04-05 VITALS — BP 101/70 | HR 72 | Resp 16 | Ht 69.0 in | Wt 211.9 lb

## 2021-04-05 DIAGNOSIS — N951 Menopausal and female climacteric states: Secondary | ICD-10-CM | POA: Diagnosis not present

## 2021-04-05 DIAGNOSIS — Z79890 Hormone replacement therapy (postmenopausal): Secondary | ICD-10-CM

## 2021-04-05 DIAGNOSIS — E782 Mixed hyperlipidemia: Secondary | ICD-10-CM

## 2021-04-05 DIAGNOSIS — Z30011 Encounter for initial prescription of contraceptive pills: Secondary | ICD-10-CM | POA: Diagnosis not present

## 2021-04-05 MED ORDER — LEVONORGEST-ETH ESTRAD 91-DAY 0.15-0.03 &0.01 MG PO TABS
1.0000 | ORAL_TABLET | Freq: Every day | ORAL | 1 refills | Status: DC
Start: 2021-04-05 — End: 2021-09-28

## 2021-04-05 NOTE — Progress Notes (Signed)
HPI:      [name] is a 45 y.o. [phone] who LMP was No LMP recorded. (Menstrual status: Other).  Subjective:   She presents today for follow-up from her last visit for premature menopause.  Patient has had some lab work performed regarding her previous concerns with her liver. I have reviewed these results and noted elevated cholesterol and lipids as well as an elevated lipase but otherwise normal labs.  I see no contraindication to OCPs for HRT in a young woman. In addition patient had questions regarding her Ozempic for weight loss.  I did not prescribe this medication.    Hx: The following portions of the patient's history were reviewed and updated as appropriate:             She  has a past medical history of Spina bifida (Gabrielle Gutierrez). She does not have any pertinent problems on file. She  has a past surgical history that includes Appendectomy; Cholecystectomy; Cesarean section; Colonoscopy with propofol (N/A, 04/05/2020); and Esophagogastroduodenoscopy (egd) with propofol (N/A, 09/14/2020). Her family history includes Breast cancer in her mother; Cancer in her mother; Rheum arthritis in her father. She  reports that she has never smoked. She has never used smokeless tobacco. She reports current alcohol use. She reports that she does not use drugs. She has a current medication list which includes the following prescription(s): levonorgestrel-ethinyl estradiol and ozempic (0.25 or 0.5 mg/dose). She has No Known Allergies.       Review of Systems:  Review of Systems  Constitutional: Denied constitutional symptoms, night sweats, recent illness, fatigue, fever, insomnia and weight loss.  Eyes: Denied eye symptoms, eye pain, photophobia, vision change and visual disturbance.  Ears/Nose/Throat/Neck: Denied ear, nose, throat or neck symptoms, hearing loss, nasal discharge, sinus congestion and sore throat.  Cardiovascular: Denied cardiovascular symptoms, arrhythmia, chest pain/pressure,  edema, exercise intolerance, orthopnea and palpitations.  Respiratory: Denied pulmonary symptoms, asthma, pleuritic pain, productive sputum, cough, dyspnea and wheezing.  Gastrointestinal: Denied, gastro-esophageal reflux, melena, nausea and vomiting.  Genitourinary: Denied genitourinary symptoms including symptomatic vaginal discharge, pelvic relaxation issues, and urinary complaints.  Musculoskeletal: Denied musculoskeletal symptoms, stiffness, swelling, muscle weakness and myalgia.  Dermatologic: Denied dermatology symptoms, rash and scar.  Neurologic: Denied neurology symptoms, dizziness, headache, neck pain and syncope.  Psychiatric: Denied psychiatric symptoms, anxiety and depression.  Endocrine: Denied endocrine symptoms including hot flashes and night sweats.   Meds:   Current Outpatient Medications on File Prior to Visit  Medication Sig Dispense Refill   OZEMPIC, 0.25 OR 0.5 MG/DOSE, 2 MG/1.5ML SOPN Inject into the skin.     No current facility-administered medications on file prior to visit.      Objective:     Vitals:   04/05/21 1008  BP: 101/70  Pulse: 72  Resp: 16   Filed Weights   04/05/21 1008  Weight: 211 lb 14.4 oz (96.1 kg)              Lab results reviewed with the patient directly.          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 Symptomatic menopausal or female climacteric states   2. Postmenopausal hormone therapy   3. Elevated cholesterol with elevated triglycerides   4. Initiation of OCP (BCP)     Patient is in early menopause and not  using any ERT.  We have discussed ERT in detail and the usefulness in young women of OCPs to perform this function.   Plan:            1.  OCPs The risks /benefits of OCPs have been explained to the patient in detail.  Product literature has been given to her  where appropriate.  I have instructed her in the use of OCPs.  I have explained to the patient that OCPs are not as effective for birth control during the first month of use, and that another form of contraception should be used during this time.  Both first-day start and Sunday start have been explained.  The risks and benefits of each was discussed.  She has been made aware of  the fact that in rare circumstances, other medications may affect the efficacy of OCPs.  I have answered all of her questions, and I believe that she has an understanding of the effectiveness and use of OCPs. Patient has chosen [employer]. 2.  We have discussed elevated cholesterol and lipids.  We have discussed dietary modification as well as possible use of statins.  She will take this manner up with her PCP in the near future. 3.  I have informed her that Ozempic is not a medication that I prescribed and I cannot advise her on its use.  Orders No orders of the defined types were placed in this encounter.    Meds ordered this encounter  Medications   Levonorgestrel-Ethinyl Estradiol (AMETHIA) 0.15-0.03 &0.01 MG tablet    Sig: Take 1 tablet by mouth at bedtime.    Dispense:  84 tablet    Refill:  1      F/U  Return in about 4 months (around 08/03/2021). I spent 32 minutes involved in the care of this patient preparing to see the patient by obtaining and reviewing her medical history (including labs, imaging tests and prior procedures), documenting clinical information in the electronic health record (EHR), counseling and coordinating care plans, writing and sending prescriptions, ordering tests or procedures and in direct communicating with the patient and medical staff discussing pertinent items from her history and physical exam.  Finis Bud, M.D. 04/05/2021 10:47 AM

## 2021-08-24 ENCOUNTER — Encounter: Payer: Medicaid Other | Admitting: Obstetrics and Gynecology

## 2021-08-24 DIAGNOSIS — N951 Menopausal and female climacteric states: Secondary | ICD-10-CM

## 2021-08-24 DIAGNOSIS — Z79890 Hormone replacement therapy (postmenopausal): Secondary | ICD-10-CM

## 2021-09-14 IMAGING — US US RENAL
1 series · 14 of 25 positions shown · non-contrast
Comparison: None.

CLINICAL DATA: Acute right flank pain.

EXAM:
RENAL / URINARY TRACT ULTRASOUND COMPLETE

[Series 1: us renal · 0.25mm/px · 14 of 29 slices shown]
[im 1/29]
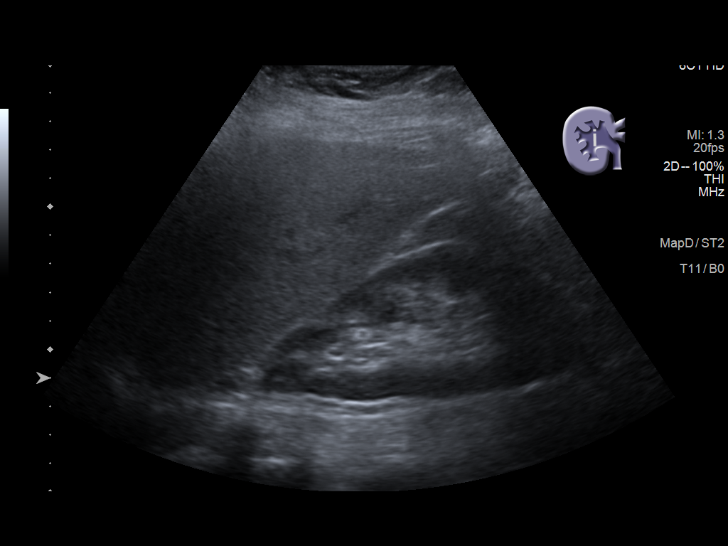
[im 3/29]
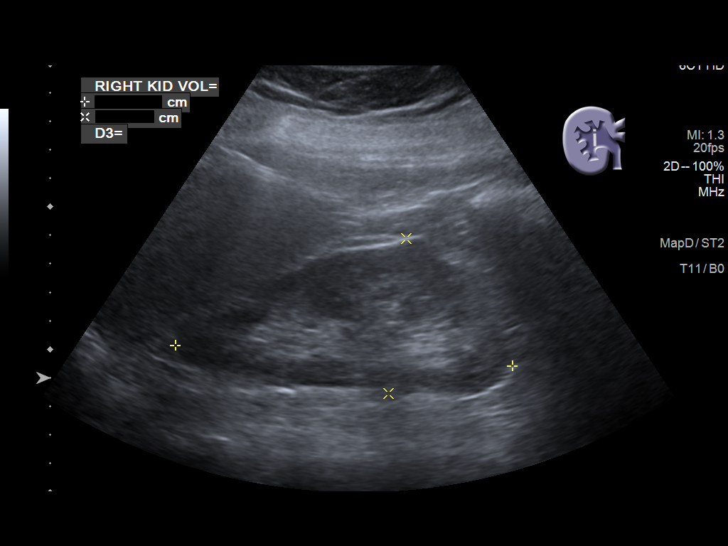
[im 5/29]
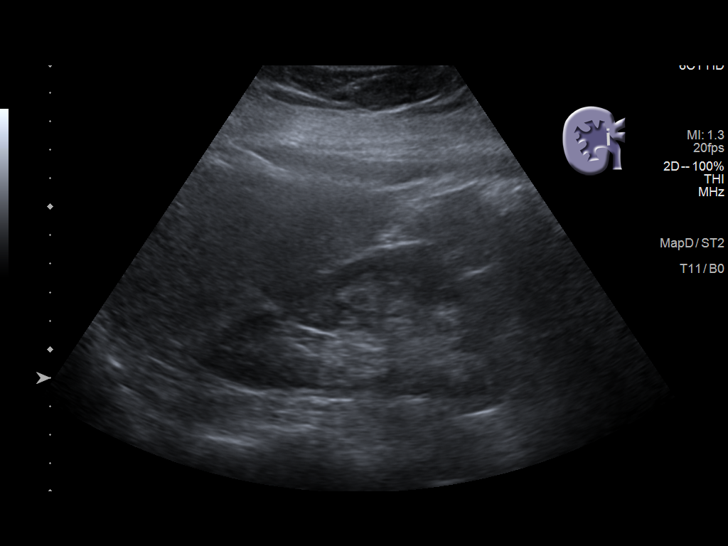
[im 8/29]
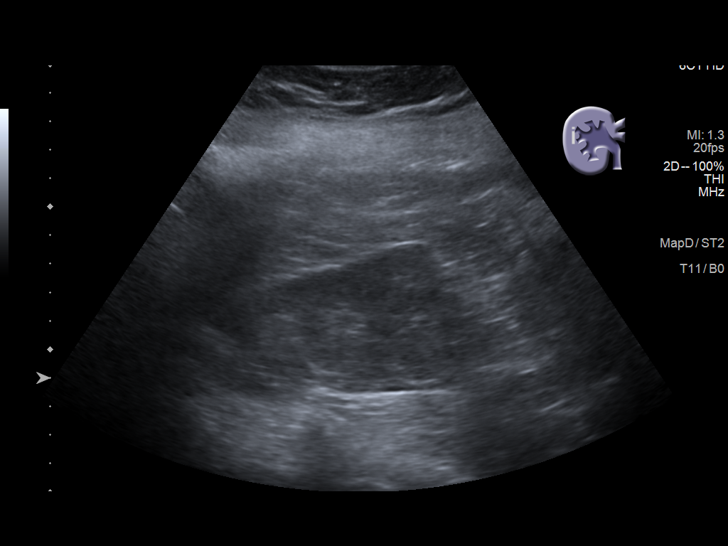
[im 10/29]
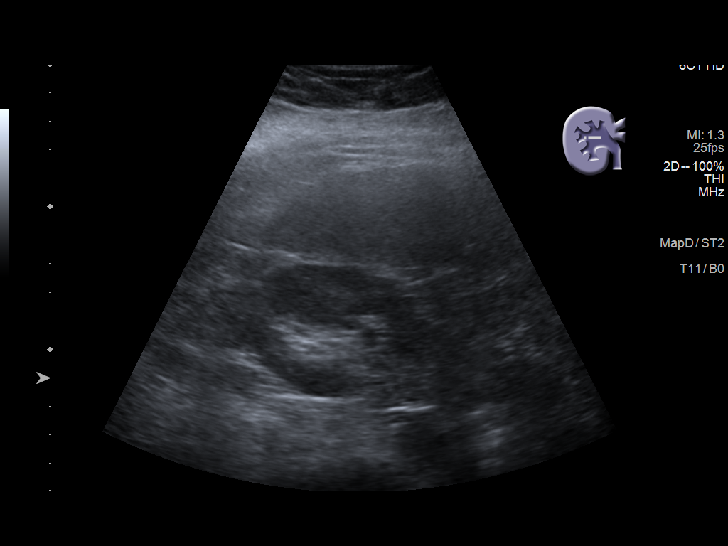
[im 11/29]
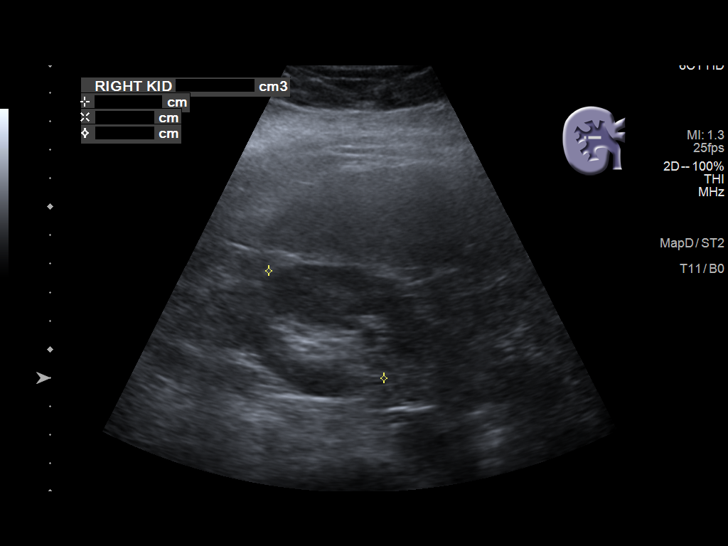
[im 13/29]
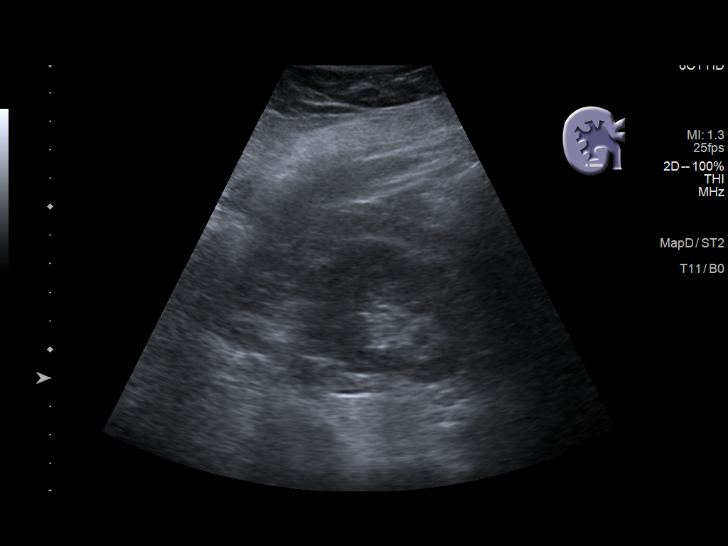
[im 16/29]
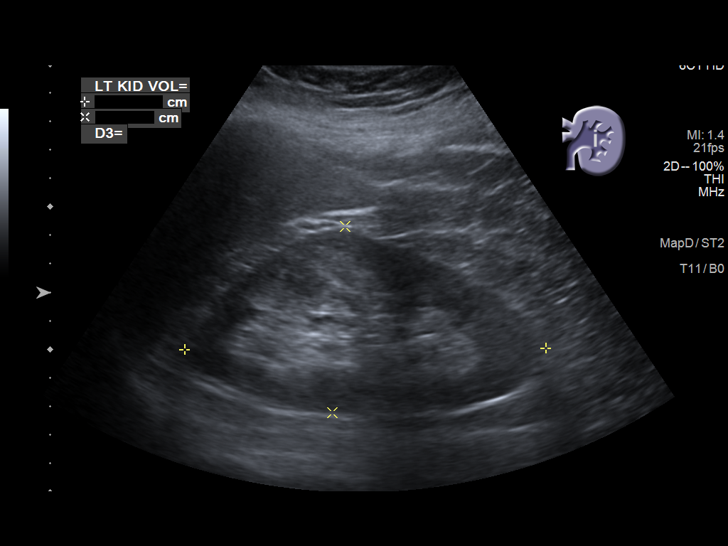
[im 18/29]
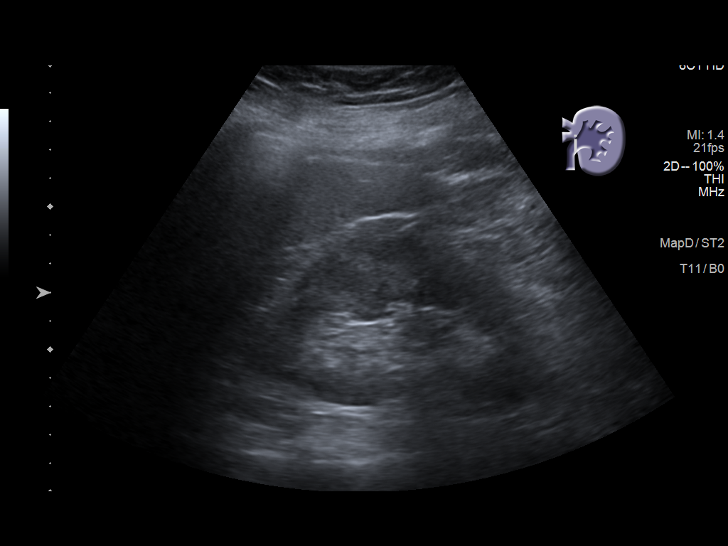
[im 19/29]
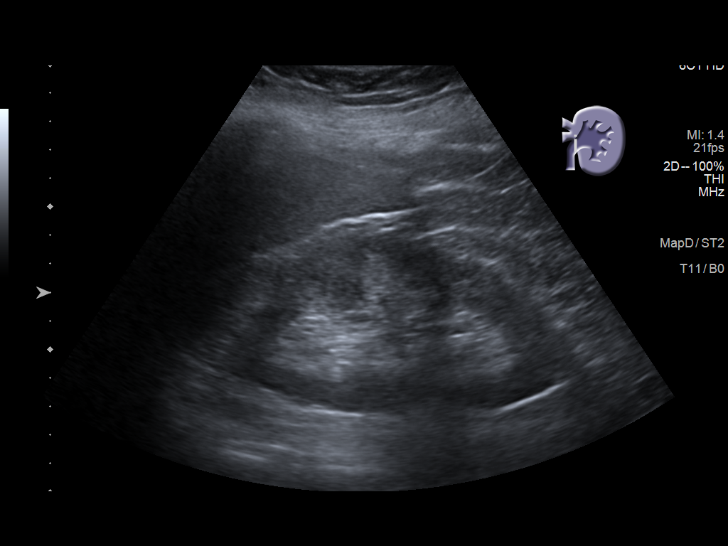
[im 22/29]
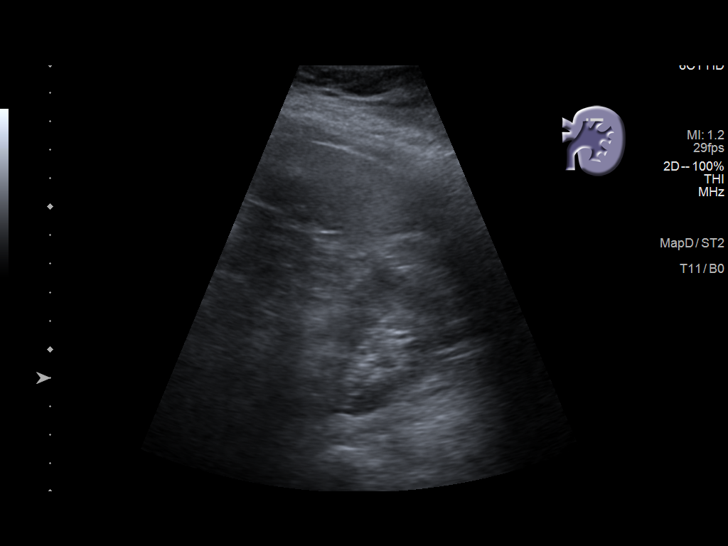
[im 24/29]
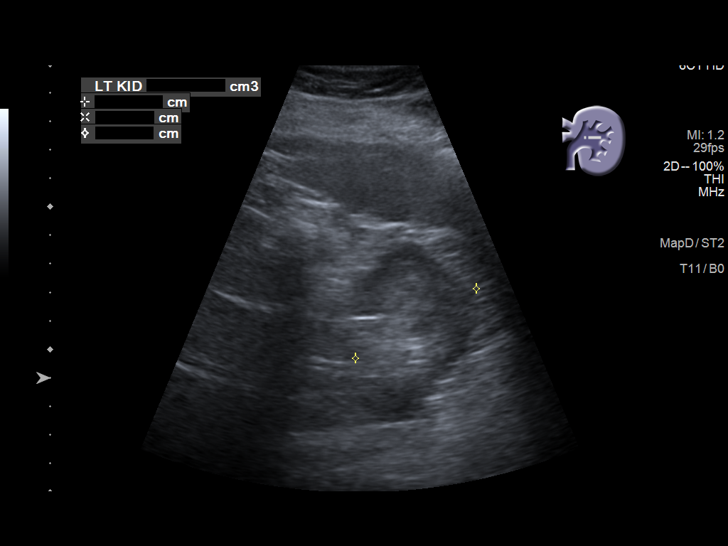
[im 26/29]
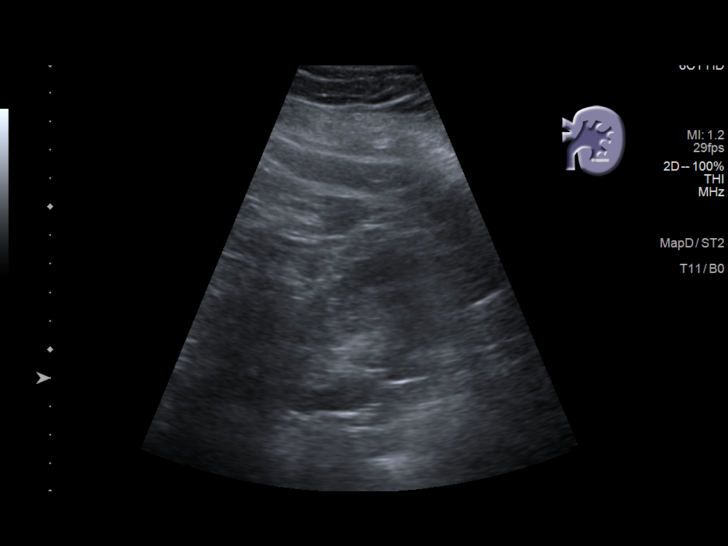
[im 29/29]
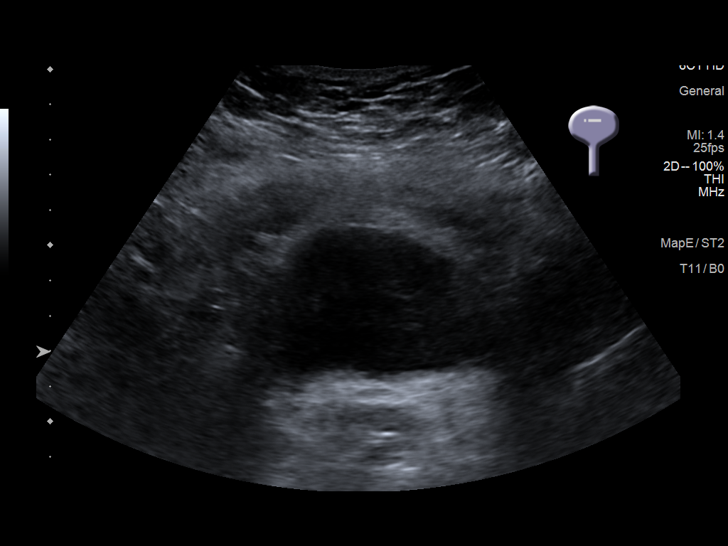

[14 of 25 positions shown; findings below may reference images not displayed]

FINDINGS: Right Kidney:

Renal measurements: 11.8 x 5.5 x 5.5 cm = volume: 187 mL.
Echogenicity within normal limits. No mass or hydronephrosis
visualized.

Left Kidney:

Renal measurements: 12.7 x 6.5 x 4.9 cm = volume: 212 mL.
Echogenicity within normal limits. No mass or hydronephrosis
visualized.

Bladder:

Appears normal for degree of bladder distention.

Other:

None.
IMPRESSION: Normal renal ultrasound.

## 2021-09-14 IMAGING — US US ABDOMEN LIMITED
1 series · 14 of 25 positions shown · non-contrast
Comparison: None.

CLINICAL DATA: Right upper quadrant pain.

EXAM:
ULTRASOUND ABDOMEN LIMITED RIGHT UPPER QUADRANT

[Series 1: us abdomen limited · 0.23mm/px · 14 of 26 slices shown]
[im 1/26]
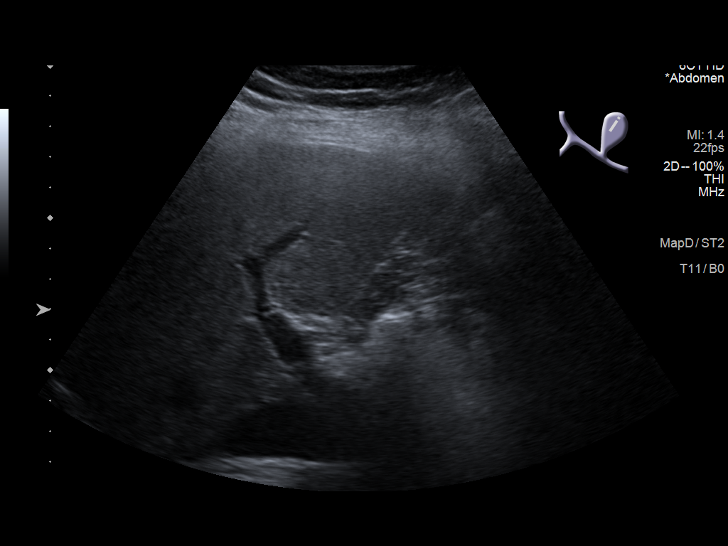
[im 3/26]
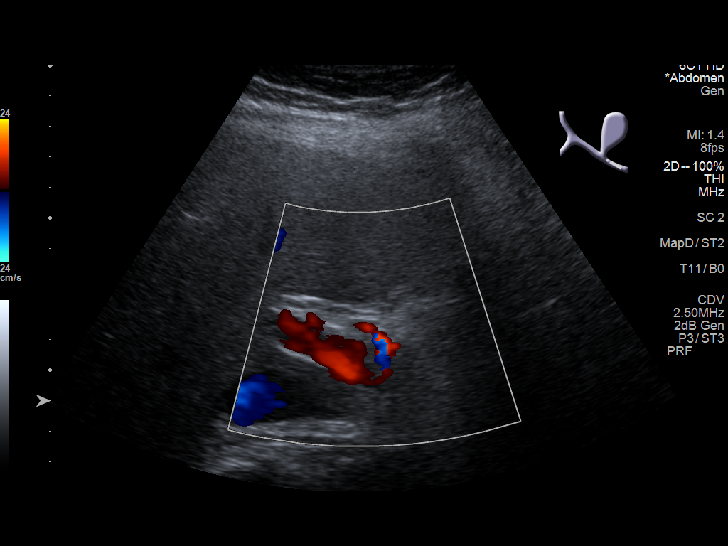
[im 5/26]
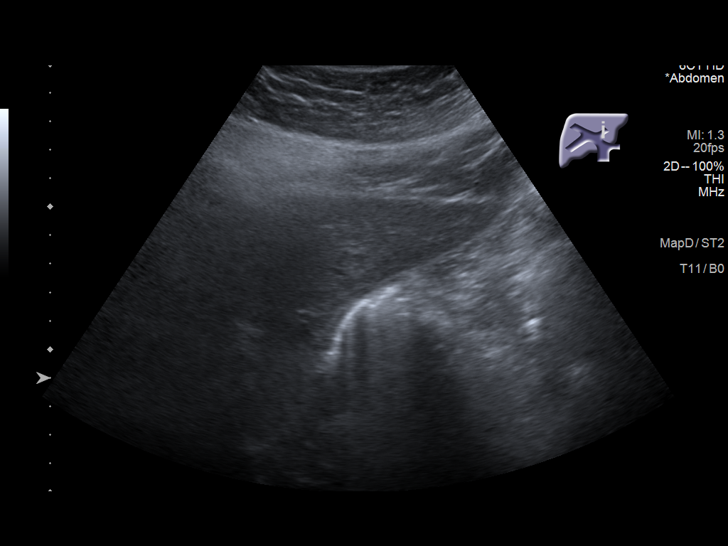
[im 7/26]
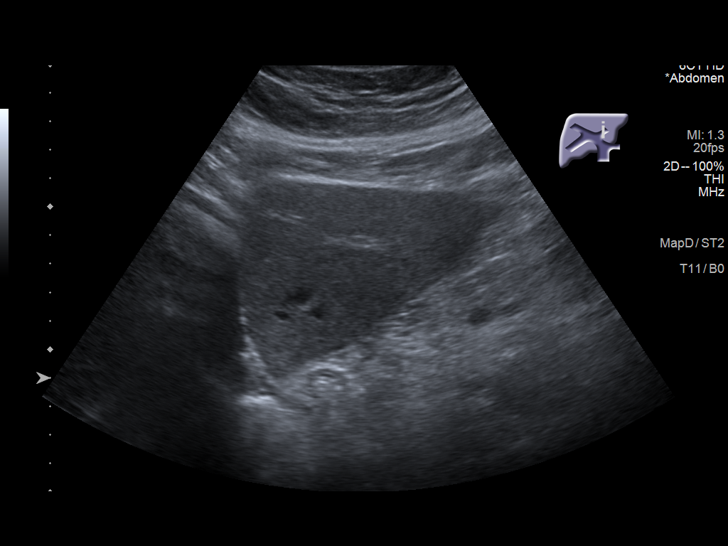
[im 9/26]
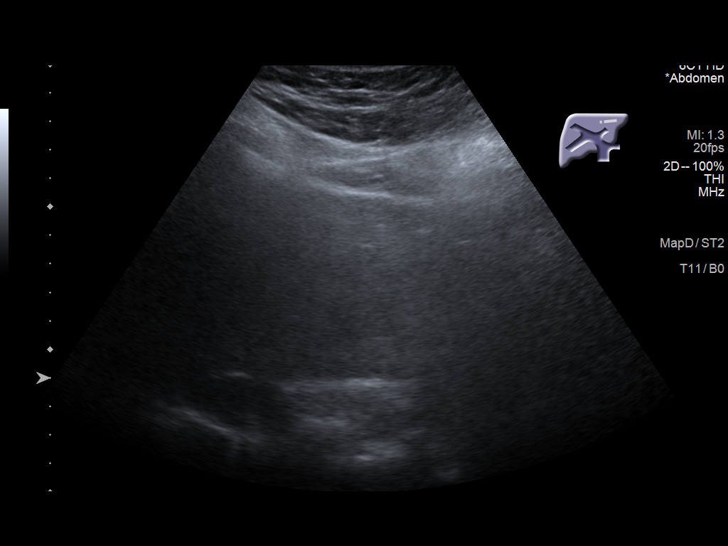
[im 10/26]
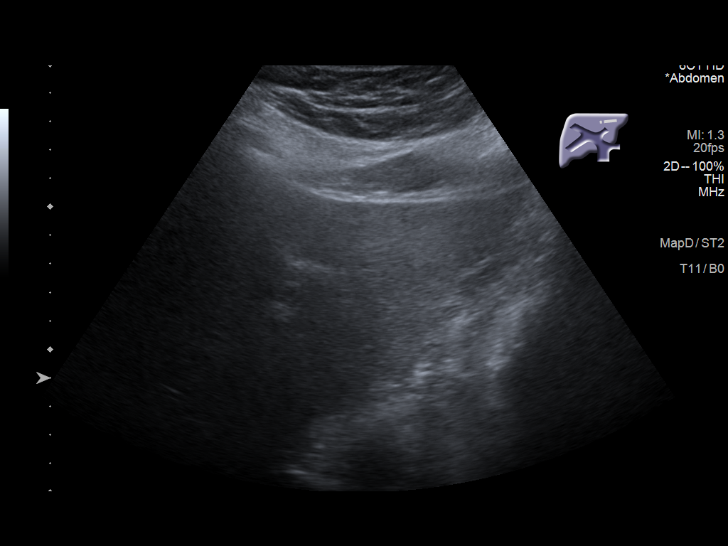
[im 12/26]
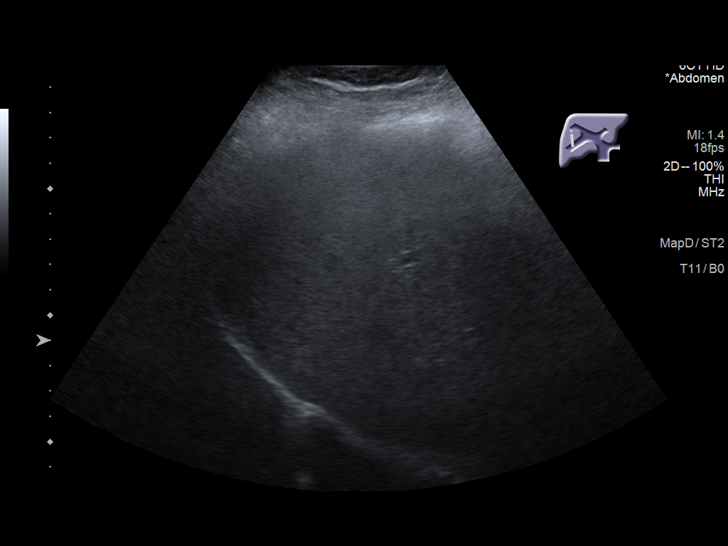
[im 14/26]
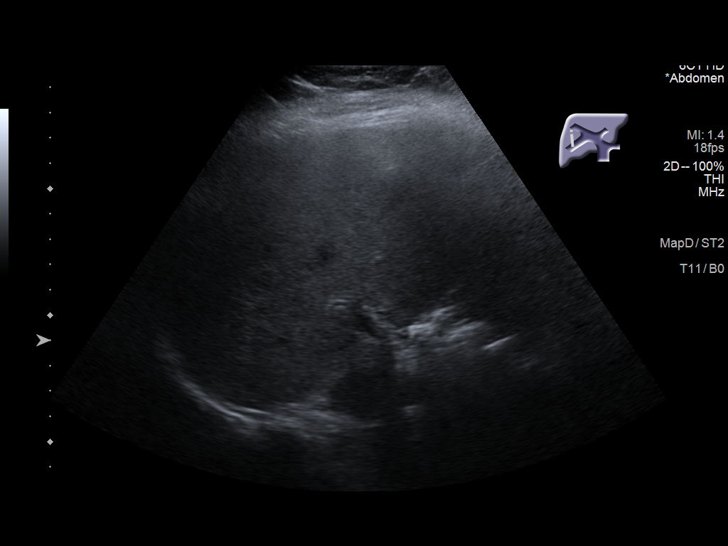
[im 16/26]
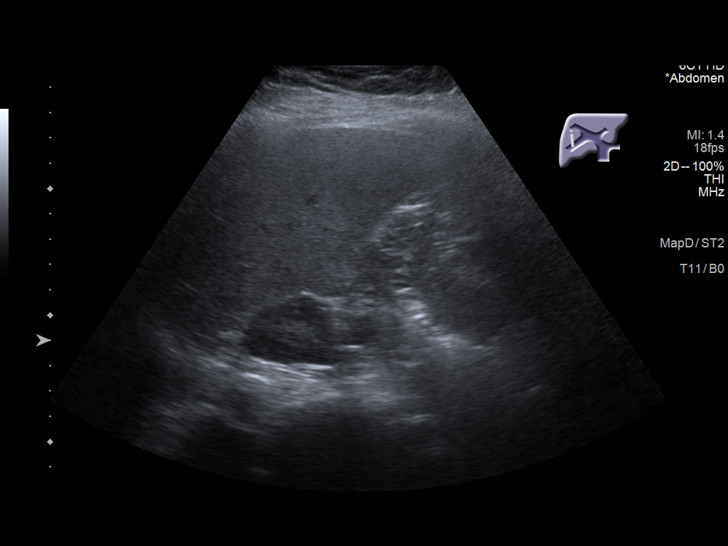
[im 17/26]
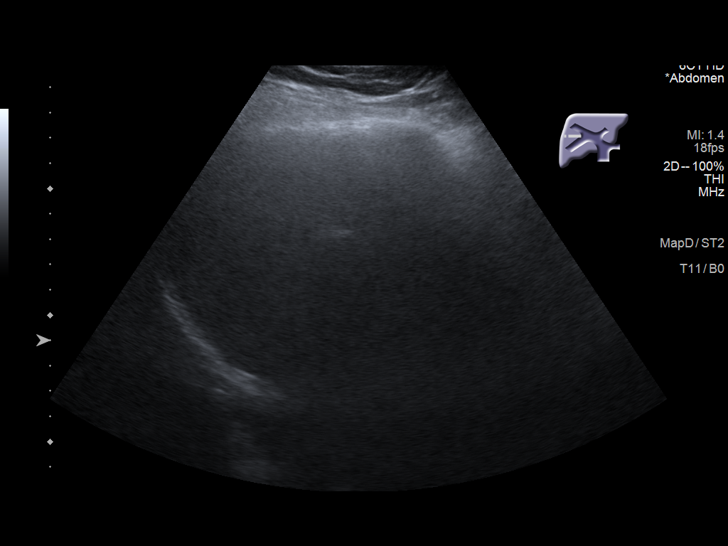
[im 19/26]
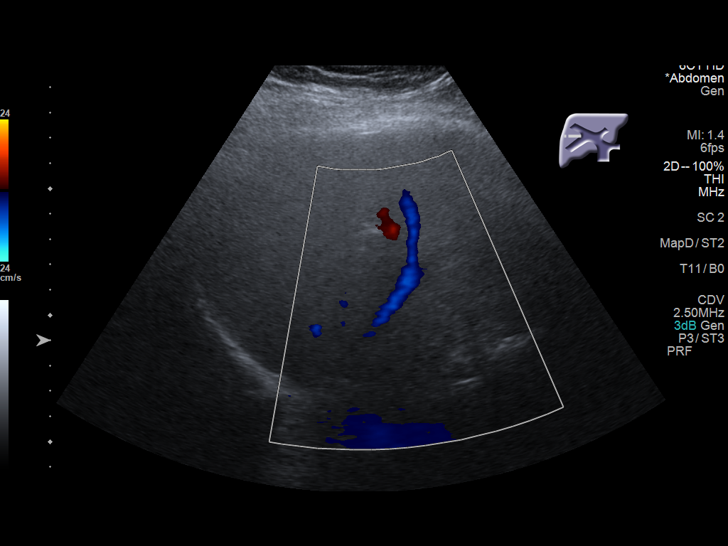
[im 21/26]
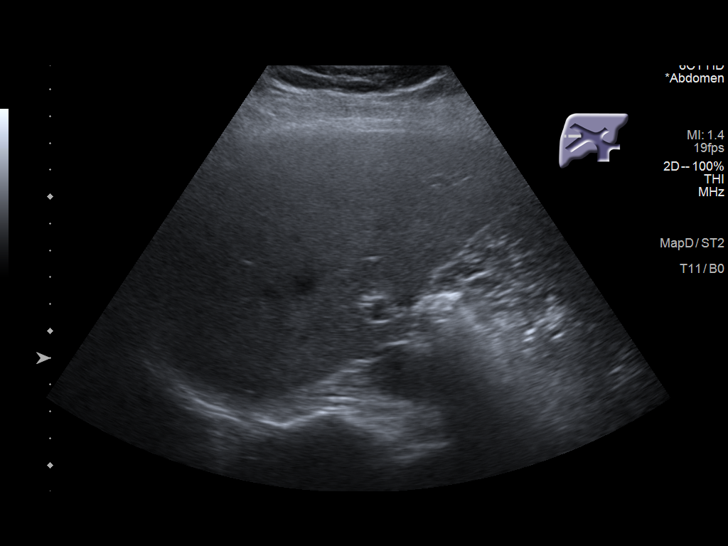
[im 23/26]
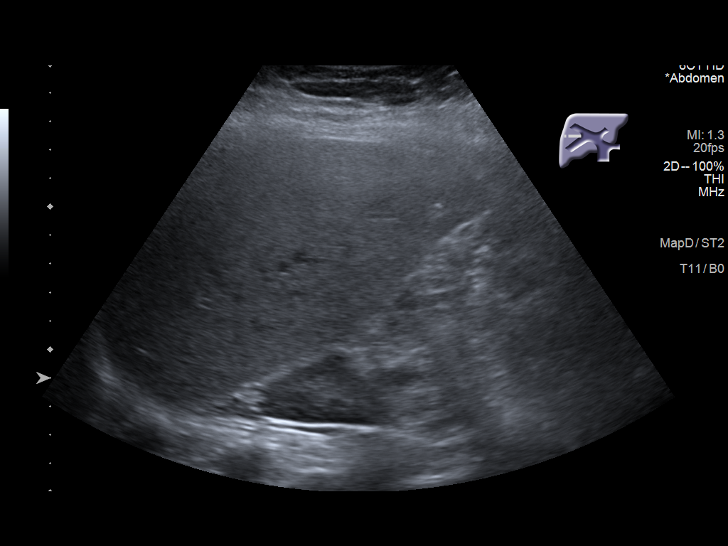
[im 26/26]
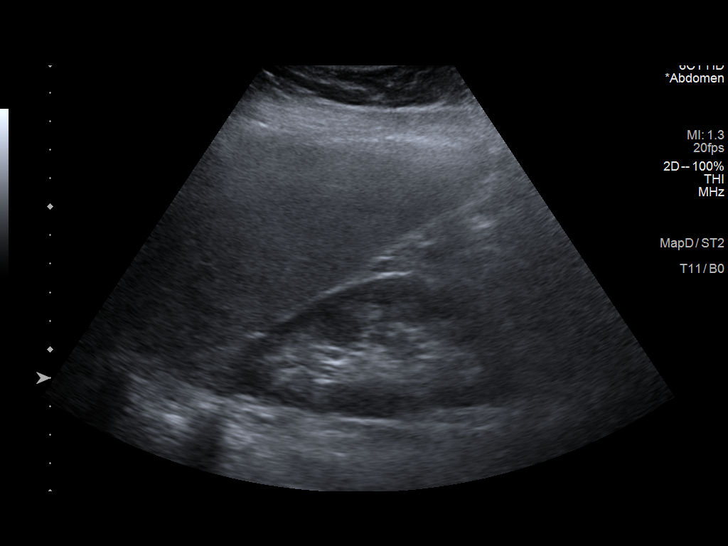

[14 of 25 positions shown; findings below may reference images not displayed]

FINDINGS: Gallbladder:

Surgically absent

Common bile duct:

Diameter: 2.4 mm

Liver:

Increased echogenicity liver diffusely. No focal liver lesion.
Portal vein is patent on color Doppler imaging with normal direction
of blood flow towards the liver.

Other: None.
IMPRESSION: Postop cholecystectomy.  No biliary dilatation

Echogenic liver consistent with fatty liver.

## 2021-09-14 IMAGING — CR DG CHEST 2V
2 series · 2 of 2 positions shown · non-contrast
Comparison: Chest radiographs 05/12/2019.

CLINICAL DATA: Flank pain right upper quadrant and right flank.
Additional history provided: History of appendectomy,
cholecystectomy.

EXAM:
CHEST - 2 VIEW

[chest pa]
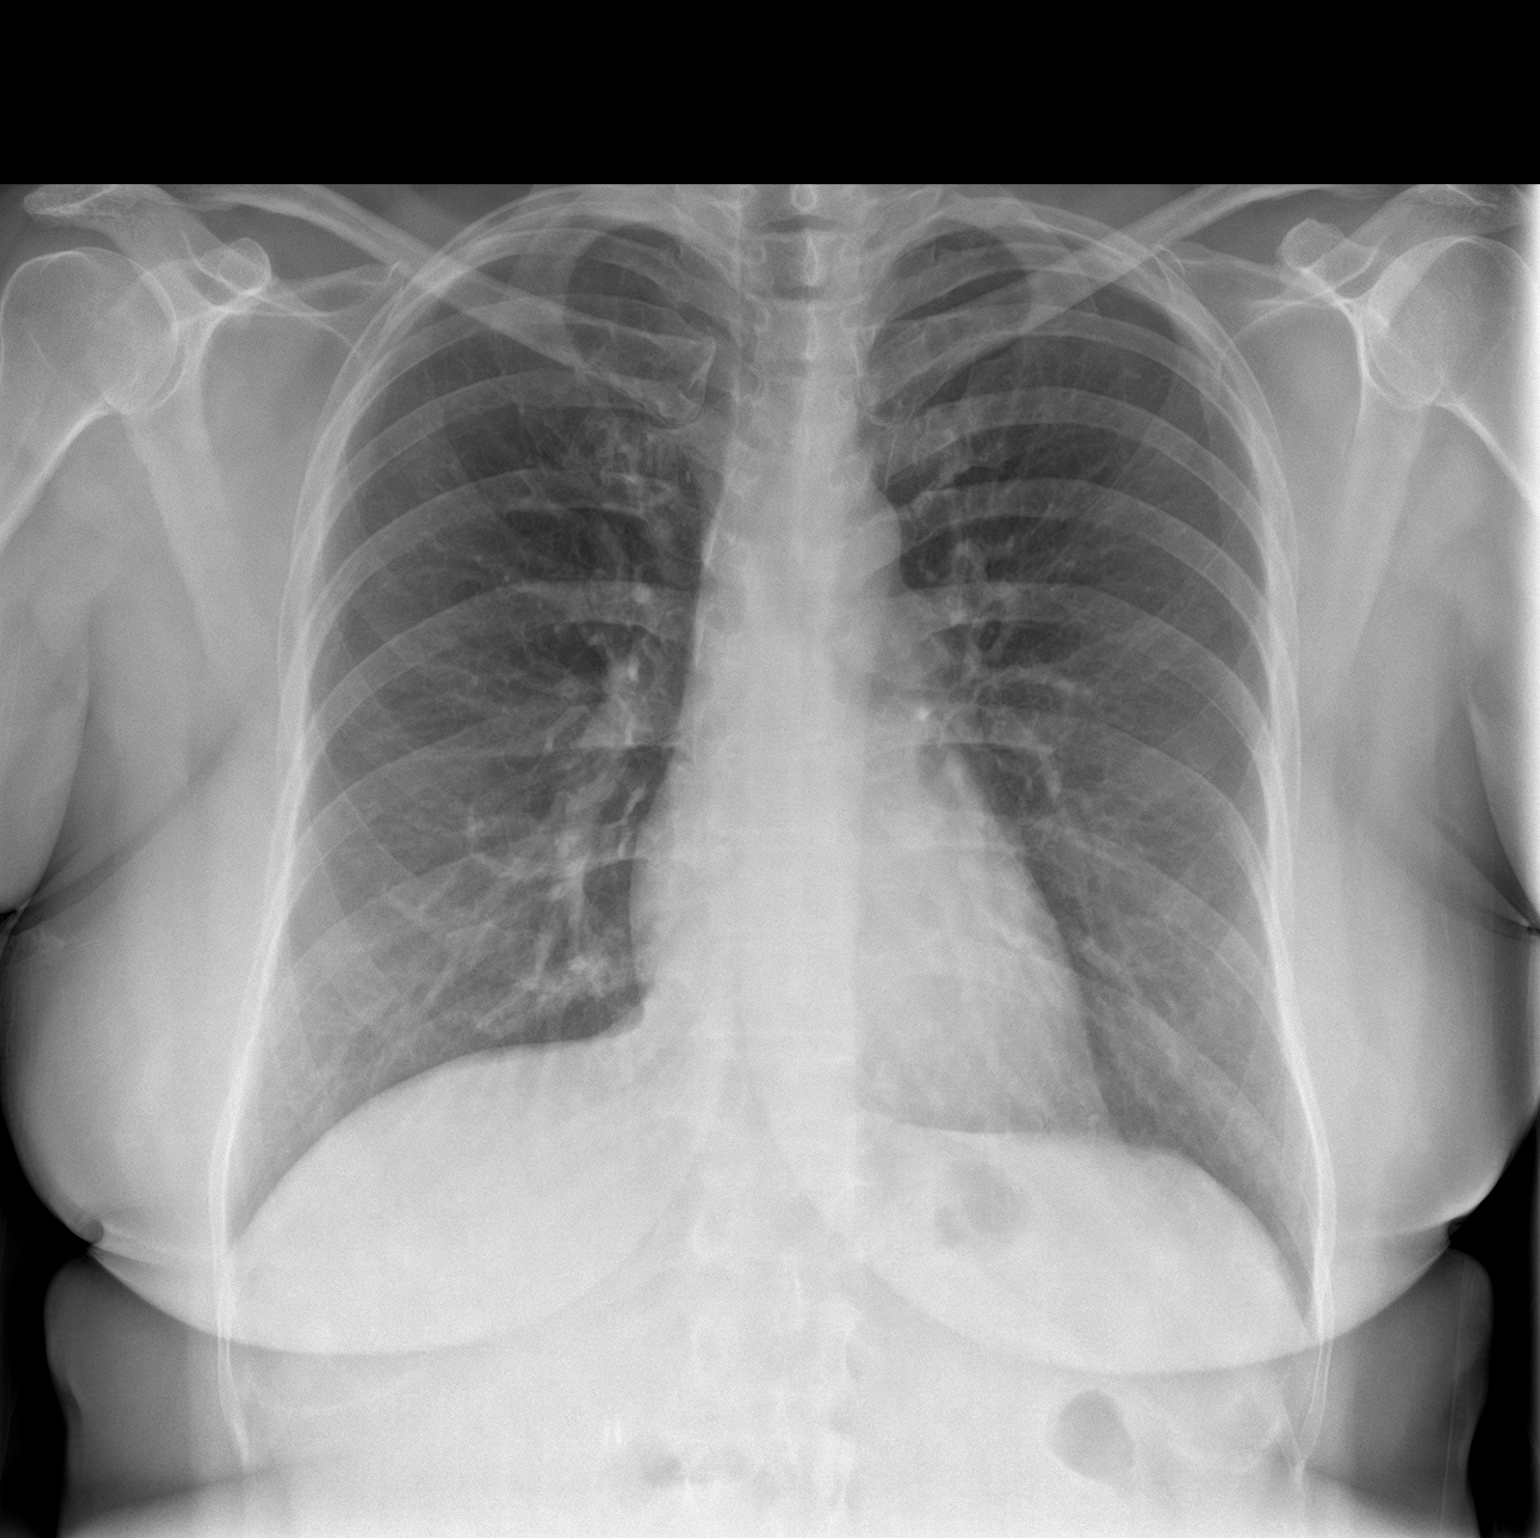

[chest lat]
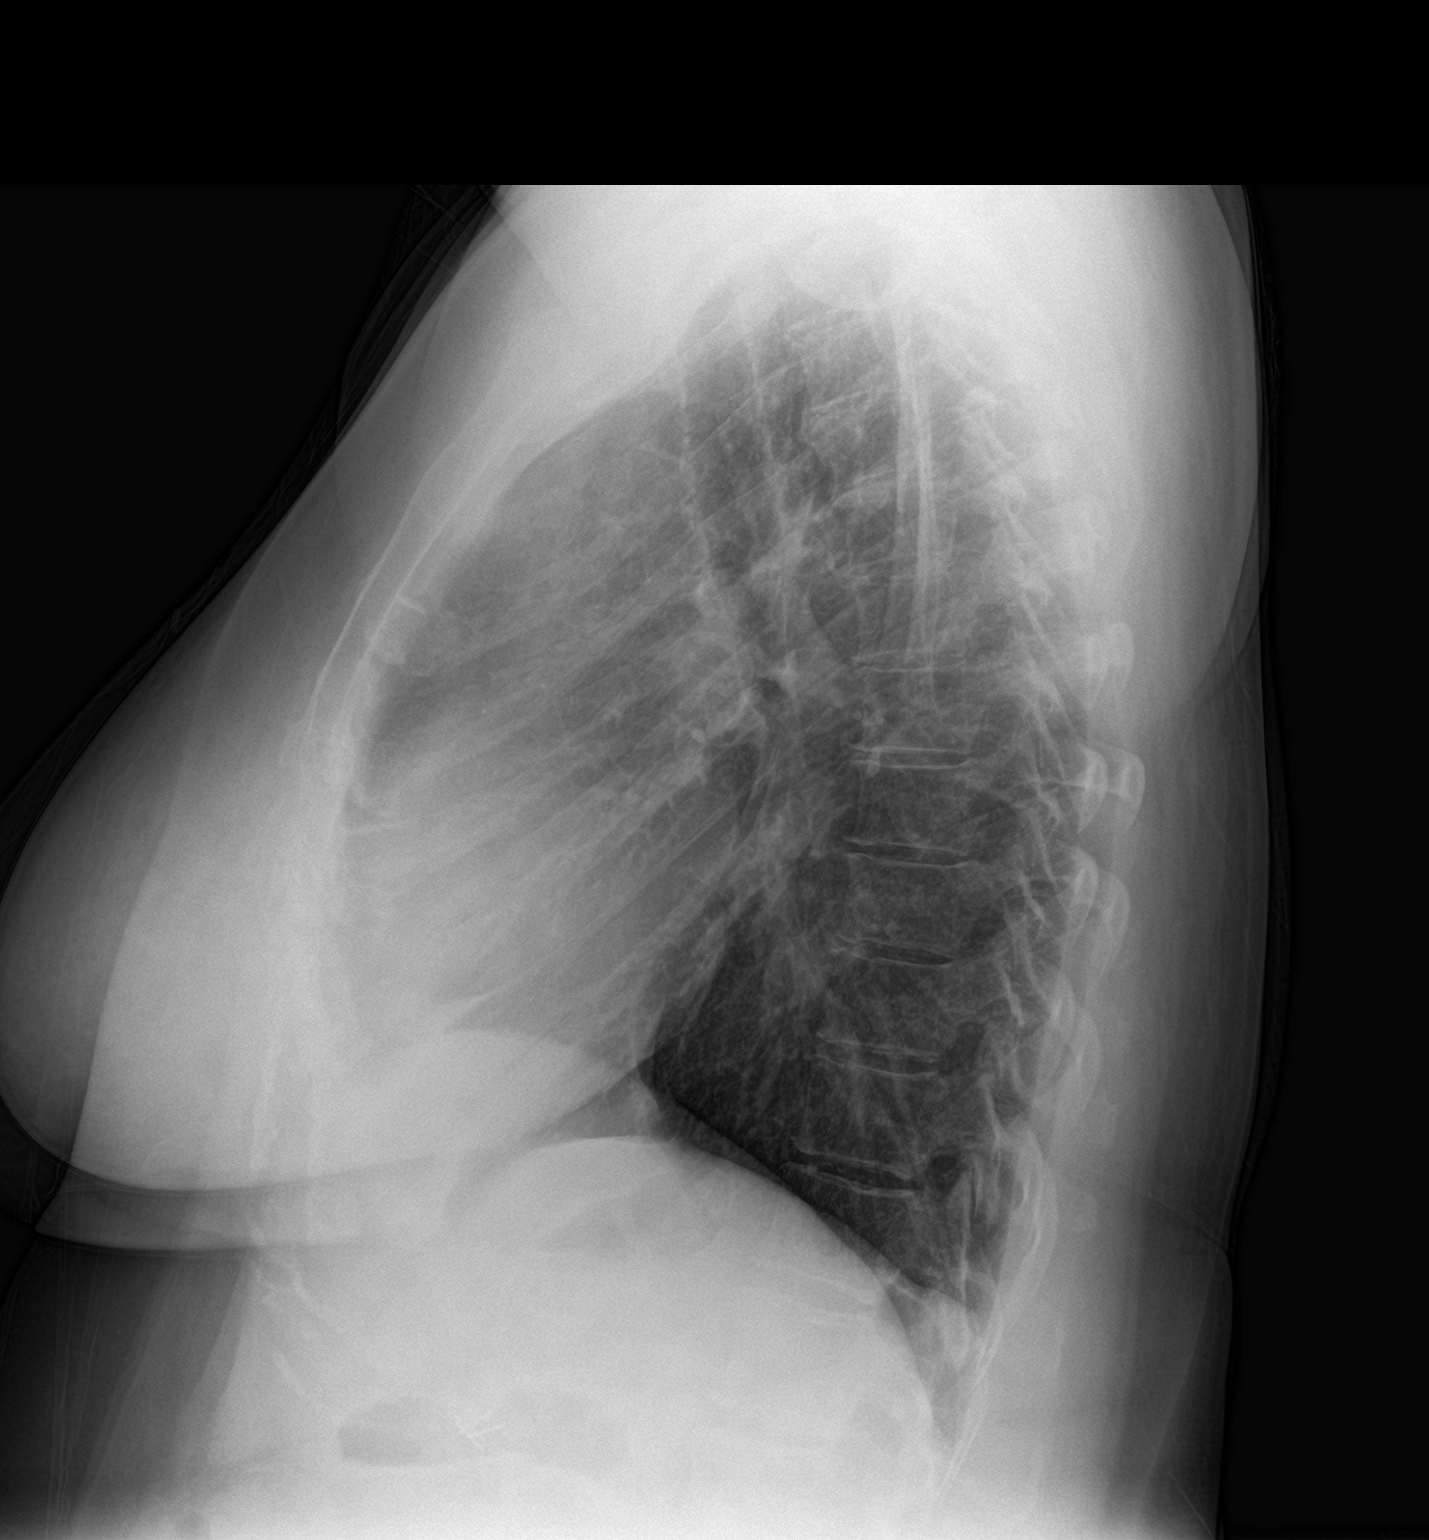

[2 of 2 positions shown; findings below may reference images not displayed]

FINDINGS: Heart size within normal limits. No appreciable airspace
consolidation or pulmonary edema. No evidence of pleural effusion or
pneumothorax. No acute bony abnormality identified.
IMPRESSION: No evidence of active cardiopulmonary disease.

## 2021-09-28 ENCOUNTER — Ambulatory Visit (INDEPENDENT_AMBULATORY_CARE_PROVIDER_SITE_OTHER): Payer: Medicaid Other | Admitting: Obstetrics and Gynecology

## 2021-09-28 ENCOUNTER — Encounter: Payer: Self-pay | Admitting: Obstetrics and Gynecology

## 2021-09-28 DIAGNOSIS — Z79890 Hormone replacement therapy (postmenopausal): Secondary | ICD-10-CM

## 2021-09-28 DIAGNOSIS — N951 Menopausal and female climacteric states: Secondary | ICD-10-CM

## 2021-09-28 MED ORDER — LEVONORGEST-ETH ESTRAD 91-DAY 0.15-0.03 &0.01 MG PO TABS: 1.0000 | ORAL_TABLET | Freq: Every day | ORAL | 1 refills | Status: DC

## 2021-09-28 NOTE — Progress Notes (Signed)
Patient presents today for OCP follow-up. She states no concerns with new treatment. Patient is happy she is not having periods. Patient states no other questions or concerns at this time. ?

## 2021-09-28 NOTE — Progress Notes (Signed)
HPI: ?     [name] is a 46 y.o. [id number] who LMP was Patient's last menstrual period was 05/26/2021 (approximate). ? ?Subjective:  ? ?She presents today for follow-up of OCPs.  She is on OCPs for early menopause.  She is happy on them.  Reports that she feels much better. ?Significant note she has recently undergone abdominoplasty and liposuction surgery and she is very excited and happy about her results.  She has also recently moved out of her previous house to a new house in [place].  These are very exciting times for her. ? ?  Hx: ?The following portions of the patient's history were reviewed and updated as appropriate: ?            She  has a past medical history of Spina bifida ([place]). ?She does not have any pertinent problems on file. ?She  has a past surgical history that includes Appendectomy; Cholecystectomy; Cesarean section; Colonoscopy with propofol (N/A, 04/05/2020); and Esophagogastroduodenoscopy (egd) with propofol (N/A, 09/14/2020). ?Her family history includes Breast cancer in her mother; Cancer in her mother; Rheum arthritis in her father. ?She  reports that she has never smoked. She has never used smokeless tobacco. She reports current alcohol use. She reports that she does not use drugs. ?She has a current medication list which includes the following prescription(s): ozempic (0.25 or 0.5 mg/dose) and levonorgestrel-ethinyl estradiol. ?She has No Known Allergies. ?      ?Review of Systems:  ?Review of Systems ? ?Constitutional: Denied constitutional symptoms, night sweats, recent illness, fatigue, fever, insomnia and weight loss.  ?Eyes: Denied eye symptoms, eye pain, photophobia, vision change and visual disturbance.  ?Ears/Nose/Throat/Neck: Denied ear, nose, throat or neck symptoms, hearing loss, nasal discharge, sinus congestion and sore throat.  ?Cardiovascular: Denied cardiovascular symptoms, arrhythmia, chest pain/pressure, edema, exercise intolerance, orthopnea and  palpitations.  ?Respiratory: Denied pulmonary symptoms, asthma, pleuritic pain, productive sputum, cough, dyspnea and wheezing.  ?Gastrointestinal: Denied, gastro-esophageal reflux, melena, nausea and vomiting.  ?Genitourinary: Denied genitourinary symptoms including symptomatic vaginal discharge, pelvic relaxation issues, and urinary complaints.  ?Musculoskeletal: Denied musculoskeletal symptoms, stiffness, swelling, muscle weakness and myalgia.  ?Dermatologic: Denied dermatology symptoms, rash and scar.  ?Neurologic: Denied neurology symptoms, dizziness, headache, neck pain and syncope.  ?Psychiatric: Denied psychiatric symptoms, anxiety and depression.  ?Endocrine: Denied endocrine symptoms including hot flashes and night sweats.  ? ?Meds: ?  ?Current Outpatient Medications on File Prior to Visit  ?Medication Sig Dispense Refill  ? OZEMPIC, 0.25 OR 0.5 MG/DOSE, 2 MG/1.5ML SOPN Inject into the skin.    ? ?No current facility-administered medications on file prior to visit.  ? ? ? ? ?Objective:  ?  ? ?Vitals:  ? 09/28/21 1111  ?BP: 122/78  ?Pulse: 78  ? ?Filed Weights  ? 09/28/21 1111  ?Weight: 186 lb 6.4 oz (84.6 kg)  ? ?  ?          ?        ? ?Assessment:  ?  ?G2P2002 ?Patient Active Problem List  ? Diagnosis Date Noted  ? Abdominal pain, chronic, right upper quadrant   ? Globus pharyngeus 12/04/2018  ? Halitosis 12/04/2018  ? ?  ?1. Symptomatic menopausal or female climacteric states   ?2. Postmenopausal hormone therapy   ? ? Doing very well on OCPs for HRT. ? ? ?Plan:  ?  ?       ? 1.  Continue OCPs for HRT. ? 2.  Follow-up for annual examination in September. ?  Orders ?No orders of the defined types were placed in this encounter. ? ?  ?Meds ordered this encounter  ?Medications  ? Levonorgestrel-Ethinyl Estradiol (AMETHIA) 0.15-0.03 &0.01 MG tablet  ?  Sig: Take 1 tablet by mouth at bedtime.  ?  Dispense:  84 tablet  ?  Refill:  1  ?  ?  F/U ? Return in about 4 months (around 01/29/2022). ?I spent 22 minutes  involved in the care of this patient preparing to see the patient by obtaining and reviewing her medical history (including labs, imaging tests and prior procedures), documenting clinical information in the electronic health record (EHR), counseling and coordinating care plans, writing and sending prescriptions, ordering tests or procedures and in direct communicating with the patient and medical staff discussing pertinent items from her history and physical exam. ? ?Finis Bud, M.D. ?09/28/2021 ?11:38 AM ? ? ? ? ?

## 2021-10-10 ENCOUNTER — Other Ambulatory Visit: Payer: Self-pay

## 2021-10-10 ENCOUNTER — Encounter: Payer: Self-pay | Admitting: Emergency Medicine

## 2021-10-10 ENCOUNTER — Emergency Department
Admission: EM | Admit: 2021-10-10 | Discharge: 2021-10-10 | Disposition: A | Discharge status: Home / Self Care | Payer: Medicaid Other | Attending: Emergency Medicine | Admitting: Emergency Medicine

## 2021-10-10 DIAGNOSIS — R1031 Right lower quadrant pain: Secondary | ICD-10-CM | POA: Diagnosis not present

## 2021-10-10 DIAGNOSIS — R197 Diarrhea, unspecified: Secondary | ICD-10-CM | POA: Insufficient documentation

## 2021-10-10 DIAGNOSIS — R1032 Left lower quadrant pain: Secondary | ICD-10-CM | POA: Diagnosis not present

## 2021-10-10 LAB — COMPREHENSIVE METABOLIC PANEL
ALT: 46 U/L — ABNORMAL HIGH (ref 0–44)
AST: 23 U/L (ref 15–41)
Albumin: 3.5 g/dL (ref 3.5–5.0)
Alkaline Phosphatase: 53 U/L (ref 38–126)
Anion gap: 7 (ref 5–15)
BUN: 12 mg/dL (ref 6–20)
CO2: 26 mmol/L (ref 22–32)
Calcium: 8.7 mg/dL — ABNORMAL LOW (ref 8.9–10.3)
Chloride: 106 mmol/L (ref 98–111)
Creatinine, Ser: 0.78 mg/dL (ref 0.44–1.00)
GFR, Estimated: >60 mL/min (ref >60)
Glucose, Bld: 96 mg/dL (ref 70–99)
Potassium: 4.1 mmol/L (ref 3.5–5.1)
Sodium: 139 mmol/L (ref 135–145)
Total Bilirubin: 0.5 mg/dL (ref 0.3–1.2)
Total Protein: 7.4 g/dL (ref 6.5–8.1)

## 2021-10-10 LAB — URINALYSIS, ROUTINE W REFLEX MICROSCOPIC
Bilirubin Urine: NEGATIVE
Glucose, UA: NEGATIVE mg/dL
Ketones, ur: NEGATIVE mg/dL
Leukocytes,Ua: NEGATIVE
Nitrite: NEGATIVE
Protein, ur: NEGATIVE mg/dL
Specific Gravity, Urine: 1.014 (ref 1.005–1.030)
pH: 7 (ref 5.0–8.0)

## 2021-10-10 LAB — CBC WITH DIFFERENTIAL/PLATELET
Abs Immature Granulocytes: 0.01 10*3/uL (ref 0.00–0.07)
Basophils Absolute: 0 10*3/uL (ref 0.0–0.1)
Basophils Relative: 1 %
Eosinophils Absolute: 0.1 10*3/uL (ref 0.0–0.5)
Eosinophils Relative: 3 %
HCT: 38.9 % (ref 36.0–46.0)
Hemoglobin: 12.2 g/dL (ref 12.0–15.0)
Immature Granulocytes: 0 %
Lymphocytes Relative: 39 %
Lymphs Abs: 1.7 10*3/uL (ref 0.7–4.0)
MCH: 26.9 pg (ref 26.0–34.0)
MCHC: 31.4 g/dL (ref 30.0–36.0)
MCV: 85.9 fL (ref 80.0–100.0)
Monocytes Absolute: 0.4 10*3/uL (ref 0.1–1.0)
Monocytes Relative: 10 %
Neutro Abs: 2.1 10*3/uL (ref 1.7–7.7)
Neutrophils Relative %: 47 %
Platelets: 275 10*3/uL (ref 150–400)
RBC: 4.53 MIL/uL (ref 3.87–5.11)
RDW: 13.7 % (ref 11.5–15.5)
Smear Review: NORMAL
WBC Morphology: ABNORMAL
WBC: 4.4 10*3/uL (ref 4.0–10.5)
nRBC: 0 % (ref 0.0–0.2)

## 2021-10-10 LAB — POC URINE PREG, ED: Preg Test, Ur: NEGATIVE

## 2021-10-10 LAB — LIPASE, BLOOD: Lipase: 40 U/L (ref 11–51)

## 2021-10-10 MED ORDER — AZITHROMYCIN 500 MG PO TABS
500.0000 mg | ORAL_TABLET | Freq: Once | ORAL | Status: AC
  Administered 2021-10-10: 500 mg via ORAL
  Filled 2021-10-10: qty 1

## 2021-10-10 NOTE — ED Provider Triage Note (Signed)
Emergency Medicine Provider Triage Evaluation Note  Gabrielle Gutierrez , a 46 y.o. female  was evaluated in triage.  Pt complains of lower abdominal pain, diarrhea.  Patient has had symptoms for the last 4 to 5 days.  Patient denies any nausea or vomiting.  Denies any dysuria, polyuria or hematuria.  No vaginal bleeding or discharge.  No chronic GI problems.  Did have a tummy tuck 3 months ago.  Review of Systems  Positive: Abdominal pain, diarrhea Negative: Fevers, urinary changes, emesis  Physical Exam  BP 110/68 (BP Location: Left Arm)   Pulse 63   Temp (!) 97.4 F (36.3 C) (Oral)   Resp 18   Ht '5\' 9"'$  (1.753 m)   Wt 81.6 kg   SpO2 99%   BMI 26.58 kg/m  Gen:   Awake, no distress   Resp:  Normal effort  MSK:   Moves extremities without difficulty  Other:  Slight tenderness in the bilateral lower abdominal quadrants.  Medical Decision Making  Medically screening exam initiated at 4:19 PM.  Appropriate orders placed.  Gabrielle Gutierrez was informed that the remainder of the evaluation will be completed by another provider, this initial triage assessment does not replace that evaluation, and the importance of remaining in the ED until their evaluation is complete.  Patient arrives with abdominal pain and diarrhea.  We will start labs, urinalysis from triage   Gabrielle Moll, PA-C 10/10/21 1619

## 2021-10-10 NOTE — ED Provider Notes (Signed)
[facility] Provider Note   Event Date/Time   First MD Initiated Contact with Patient 10/10/21 1955     (approximate) History  Abdominal Pain and Diarrhea  HPI Gabrielle Gutierrez is a 46 y.o. 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  Patient also endorses associated bilateral lower abdominal pain that is intermittent, 9/10, and is resolved after a bowel movement.  Patient states that since last Thursday she has been having severe abdominal pain with associated diarrhea at least 10-15 minutes after she eats.  This pain is resolved with the bowel movements and does not recur until she eats again.  Patient denies any recent travel or sick contacts.  Patient denies any pain similar to this in the past.  Patient currently denies any vision changes, tinnitus, difficulty speaking, facial droop, sore throat, chest pain, shortness of breath,  nausea/vomiting dysuria, or weakness/numbness/paresthesias in any extremity Physical Exam  Triage Vital Signs: ED Triage Vitals  Enc Vitals Group     BP 10/10/21 1611 110/68     Pulse Rate 10/10/21 1611 63     Resp 10/10/21 1611 18     Temp 10/10/21 1611 (!) 97.4 F (36.3 C)     Temp Source 10/10/21 1611 Oral     SpO2 10/10/21 1611 99 %     Weight 10/10/21 1615 180 lb (81.6 kg)     Height 10/10/21 1615 '5\' 9"'$  (1.753 m)     Head Circumference --      Peak Flow --      Pain Score 10/10/21 1613 9     Pain Loc --      Pain Edu? --      Excl. in [place] --    Most recent vital signs: Vitals:   10/10/21 1611 10/10/21 2045  BP: 110/68 109/64  Pulse: 63 66  Resp: 18 16  Temp: (!) 97.4 F (36.3 C)   SpO2: 99% 100%   General: Awake, oriented x4. CV:  Good peripheral perfusion.  Resp:  Normal effort.  Abd:  No distention.  Other:  Middle-aged Hispanic female sitting in chair at bedside in no acute distress ED Results / Procedures  / Treatments  Labs (all labs ordered are listed, but only abnormal results are displayed) Labs Reviewed  COMPREHENSIVE METABOLIC PANEL - Abnormal; Notable for the following components:      Result Value   Calcium 8.7 (*)    ALT 46 (*)    All other components within normal limits  URINALYSIS, ROUTINE W REFLEX MICROSCOPIC - Abnormal; Notable for the following components:   Color, Urine YELLOW (*)    APPearance CLEAR (*)    Hgb urine dipstick SMALL (*)    Bacteria, UA MANY (*)    All other components within normal limits  LIPASE, BLOOD  CBC WITH DIFFERENTIAL/PLATELET  POC URINE PREG, ED   PROCEDURES: Critical Care performed: No Procedures MEDICATIONS ORDERED IN ED: Medications  azithromycin (ZITHROMAX) tablet 500 mg (500 mg Oral Given 10/10/21 2137)   IMPRESSION / MDM / ASSESSMENT AND PLAN / ED COURSE  I reviewed the triage vital signs and the nursing notes.                             The patient is on the cardiac monitor to evaluate for evidence of arrhythmia and/or significant heart rate changes.  This patient presents with diarrhea consistent with likely viral enteritis. Doubt acute bacterial diarrhea. Considered, but think unlikely, partial SBO, appendicitis, diverticulitis, other intraabdominal infection. Low suspicion for secondary causes of diarrhea such as hyperadrenergic state, pheo, adrenal crisis, hyperthyroidism, or sepsis. Doubt antibiotic associated diarrhea.  Plan: PO rehydration, reassess, discharge with OTC antidiarrheal meds//short course antibiotics  Dispo: Discharge home with PCP follow-up and strict return precautions    FINAL CLINICAL IMPRESSION(S) / ED DIAGNOSES   Final diagnoses:  Diarrhea of presumed infectious origin   Rx / DC Orders   ED Discharge Orders     None      Note:  This document was prepared using Dragon voice recognition software and may include unintentional dictation errors.   Naaman Plummer, MD 10/10/21 2158

## 2021-10-10 NOTE — ED Triage Notes (Signed)
Pt via POV from home. Pt c/o generalized lower abd pain since Thursday. Pt also diarrhea, states all her BM have been liquid. Denies nausea. Denies urinary symptoms. Pt states she took Imodium with no relief. Pt has a hx of tummy tuck 3 months ago. Pt is A&Ox4 and NAD.

## 2021-10-14 ENCOUNTER — Other Ambulatory Visit: Payer: Self-pay | Admitting: Student

## 2021-10-14 DIAGNOSIS — M545 Low back pain, unspecified: Secondary | ICD-10-CM

## 2021-10-20 ENCOUNTER — Ambulatory Visit
Admission: RE | Admit: 2021-10-20 | Discharge: 2021-10-20 | Disposition: A | Discharge status: Home / Self Care | Payer: Medicaid Other | Source: Ambulatory Visit | Attending: Student | Admitting: Student

## 2021-10-20 DIAGNOSIS — M545 Low back pain, unspecified: Secondary | ICD-10-CM

## 2022-01-11 ENCOUNTER — Other Ambulatory Visit: Payer: Self-pay | Admitting: Student

## 2022-01-31 ENCOUNTER — Other Ambulatory Visit: Payer: Self-pay

## 2022-01-31 ENCOUNTER — Other Ambulatory Visit (HOSPITAL_COMMUNITY)
Admission: RE | Admit: 2022-01-31 | Discharge: 2022-01-31 | Disposition: A | Discharge status: Home / Self Care | Payer: Medicaid Other | Source: Ambulatory Visit | Attending: Obstetrics and Gynecology | Admitting: Obstetrics and Gynecology

## 2022-01-31 ENCOUNTER — Ambulatory Visit (INDEPENDENT_AMBULATORY_CARE_PROVIDER_SITE_OTHER): Payer: Medicaid Other | Admitting: Obstetrics and Gynecology

## 2022-01-31 ENCOUNTER — Other Ambulatory Visit: Payer: Self-pay | Admitting: Obstetrics and Gynecology

## 2022-01-31 ENCOUNTER — Encounter: Payer: Self-pay | Admitting: Obstetrics and Gynecology

## 2022-01-31 VITALS — BP 115/78 | HR 80 | Ht 69.0 in | Wt 190.1 lb

## 2022-01-31 DIAGNOSIS — N951 Menopausal and female climacteric states: Secondary | ICD-10-CM

## 2022-01-31 DIAGNOSIS — Z01419 Encounter for gynecological examination (general) (routine) without abnormal findings: Secondary | ICD-10-CM | POA: Diagnosis present

## 2022-01-31 DIAGNOSIS — Z1231 Encounter for screening mammogram for malignant neoplasm of breast: Secondary | ICD-10-CM

## 2022-01-31 DIAGNOSIS — Z79890 Hormone replacement therapy (postmenopausal): Secondary | ICD-10-CM

## 2022-01-31 DIAGNOSIS — Z124 Encounter for screening for malignant neoplasm of cervix: Secondary | ICD-10-CM

## 2022-01-31 DIAGNOSIS — Z01411 Encounter for gynecological examination (general) (routine) with abnormal findings: Secondary | ICD-10-CM

## 2022-01-31 MED ORDER — LEVONORGEST-ETH ESTRAD 91-DAY 0.15-0.03 &0.01 MG PO TABS: 1.0000 | ORAL_TABLET | Freq: Every day | ORAL | 3 refills | Status: AC

## 2022-01-31 NOTE — Progress Notes (Signed)
HPI:      Gabrielle Gutierrez is a 46 y.o. [phone] who LMP was No LMP recorded. (Menstrual status: Oral contraceptives).  Subjective:   She presents today for her annual examination.  She is taking OCPs for hormone replacement as she is menopausal.  She will continue these.  She denies any significant issues.    Hx: The following portions of the patient's history were reviewed and updated as appropriate:             She  has a past medical history of Spina bifida ([place]). She does not have any pertinent problems on file. She  has a past surgical history that includes Appendectomy; Cholecystectomy; Cesarean section; Colonoscopy with propofol (N/A, 04/05/2020); and Esophagogastroduodenoscopy (egd) with propofol (N/A, 09/14/2020). Her family history includes Breast cancer in her mother; Cancer in her mother; Rheum arthritis in her father. She  reports that she has never smoked. She has never used smokeless tobacco. She reports current alcohol use. She reports that she does not use drugs. She has a current medication list which includes the following prescription(s): multivitamin-iron-minerals-folic acid and levonorgestrel-ethinyl estradiol. She has No Known Allergies.       Review of Systems:  Review of Systems  Constitutional: Denied constitutional symptoms, night sweats, recent illness, fatigue, fever, insomnia and weight loss.  Eyes: Denied eye symptoms, eye pain, photophobia, vision change and visual disturbance.  Ears/Nose/Throat/Neck: Denied ear, nose, throat or neck symptoms, hearing loss, nasal discharge, sinus congestion and sore throat.  Cardiovascular: Denied cardiovascular symptoms, arrhythmia, chest pain/pressure, edema, exercise intolerance, orthopnea and palpitations.  Respiratory: Denied pulmonary symptoms, asthma, pleuritic pain, productive sputum, cough, dyspnea and wheezing.  Gastrointestinal: Denied, gastro-esophageal reflux, melena, nausea and vomiting.  Genitourinary:  Denied genitourinary symptoms including symptomatic vaginal discharge, pelvic relaxation issues, and urinary complaints.  Musculoskeletal: Denied musculoskeletal symptoms, stiffness, swelling, muscle weakness and myalgia.  Dermatologic: Denied dermatology symptoms, rash and scar.  Neurologic: Denied neurology symptoms, dizziness, headache, neck pain and syncope.  Psychiatric: Denied psychiatric symptoms, anxiety and depression.  Endocrine: Denied endocrine symptoms including hot flashes and night sweats.   Meds:   Current Outpatient Medications on File Prior to Visit  Medication Sig Dispense Refill   Levonorgestrel-Ethinyl Estradiol (AMETHIA) 0.15-0.03 &0.01 MG tablet Take 1 tablet by mouth at bedtime. 84 tablet 1   multivitamin-iron-minerals-folic acid (CENTRUM) chewable tablet Chew 1 tablet by mouth daily.     No current facility-administered medications on file prior to visit.     Objective:     There were no vitals filed for this visit.  Filed Weights   01/31/22 0849  Weight: 190 lb 1.6 oz (86.2 kg)              Physical examination General NAD, Conversant  HEENT Atraumatic; Op clear with mmm.  Normo-cephalic. Pupils reactive. Anicteric sclerae  Thyroid/Neck Smooth without nodularity or enlargement. Normal ROM.  Neck Supple.  Skin No rashes, lesions or ulceration. Normal palpated skin turgor. No nodularity.  Breasts: No masses or discharge.  Symmetric.  No axillary adenopathy.  Lungs: Clear to auscultation.No rales or wheezes. Normal Respiratory effort, no retractions.  Heart: NSR.  No murmurs or rubs appreciated. No periferal edema  Abdomen: Soft.  Non-tender.  No masses.  No HSM. No hernia  Extremities: Moves all appropriately.  Normal ROM for age. No lymphadenopathy.  Neuro: Oriented to PPT.  Normal mood. Normal affect.     Pelvic:   Vulva: Normal appearance.  No lesions.  Vagina: No lesions  or abnormalities noted.  Support: Normal pelvic support.  Urethra No masses  tenderness or scarring.  Meatus Normal size without lesions or prolapse.  Cervix: Normal appearance.  No lesions.  Anus: Normal exam.  No lesions.  Perineum: Normal exam.  No lesions.        Bimanual   Uterus: Normal size.  Non-tender.  Mobile.  AV.  Adnexae: No masses.  Non-tender to palpation.  Cul-de-sac: Negative for abnormality.     Assessment:    [id number] Patient Active Problem List   Diagnosis Date Noted   Abdominal pain, chronic, right upper quadrant    Globus pharyngeus 12/04/2018   Halitosis 12/04/2018     1. Postmenopausal hormone therapy   2. Symptomatic menopausal or female climacteric states     Patient doing well on OCPs for HRT   Plan:            1.  Basic Screening Recommendations The basic screening recommendations for asymptomatic women were discussed with the patient during her visit.  The age-appropriate recommendations were discussed with her and the rational for the tests reviewed.  When I am informed by the patient that another primary care physician has previously obtained the age-appropriate tests and they are up-to-date, only outstanding tests are ordered and referrals given as necessary.  Abnormal results of tests will be discussed with her when all of her results are completed.  Routine preventative health maintenance measures emphasized: Exercise/Diet/Weight control, Tobacco Warnings, Alcohol/Substance use risks and Stress Management  Pap performed-mammogram ordered Orders No orders of the defined types were placed in this encounter.   No orders of the defined types were placed in this encounter.         F/U  Return in about 1 year (around 02/01/2023) for Annual Physical.  Finis Bud, M.D. 01/31/2022 8:53 AM

## 2022-01-31 NOTE — Progress Notes (Signed)
Patients presents for annual exam today. She states doing well with OCP's for hormone replacement therapy. Patient is due for pap smear, ordered. Due for mammogram, ordered. Patient states no other questions or concerns at this time.

## 2022-02-01 ENCOUNTER — Other Ambulatory Visit: Payer: Self-pay | Admitting: Student

## 2022-02-01 DIAGNOSIS — M7989 Other specified soft tissue disorders: Secondary | ICD-10-CM

## 2022-02-01 DIAGNOSIS — R748 Abnormal levels of other serum enzymes: Secondary | ICD-10-CM

## 2022-02-06 ENCOUNTER — Ambulatory Visit: Payer: Medicaid Other

## 2022-02-06 LAB — CYTOLOGY - PAP
Comment: NEGATIVE
Diagnosis: NEGATIVE
High risk HPV: NEGATIVE

## 2022-02-07 ENCOUNTER — Ambulatory Visit
Admission: RE | Admit: 2022-02-07 | Discharge: 2022-02-07 | Disposition: A | Discharge status: Home / Self Care | Payer: Medicaid Other | Source: Ambulatory Visit | Attending: Student | Admitting: Student

## 2022-02-07 DIAGNOSIS — M7989 Other specified soft tissue disorders: Secondary | ICD-10-CM | POA: Insufficient documentation

## 2022-02-07 DIAGNOSIS — R748 Abnormal levels of other serum enzymes: Secondary | ICD-10-CM | POA: Diagnosis present

## 2022-02-23 ENCOUNTER — Encounter: Payer: Self-pay | Admitting: Obstetrics and Gynecology

## 2022-06-27 ENCOUNTER — Telehealth: Payer: Self-pay | Admitting: Obstetrics and Gynecology

## 2022-06-27 NOTE — Telephone Encounter (Signed)
Reached out to pt via interpreter to reschedule 07/05/2022 appt with Dr. Amalia Hailey.  Could not leave a message, phone rang and rang and then hung up.

## 2022-07-05 ENCOUNTER — Ambulatory Visit: Payer: Self-pay | Admitting: Obstetrics and Gynecology

## 2022-07-05 NOTE — Telephone Encounter (Signed)
Patient reported for appointment. Patient is aware of scheduling changes due to [facility] out of office. Patient is rescheduled for 3/1 for annual. Patient is also aware new insurance on file is out of network.

## 2022-07-06 ENCOUNTER — Ambulatory Visit: Payer: BLUE CROSS/BLUE SHIELD | Admitting: Obstetrics and Gynecology

## 2022-07-19 ENCOUNTER — Telehealth: Payer: Self-pay

## 2022-07-19 NOTE — Telephone Encounter (Signed)
Mychart message sent, appt cancelled

## 2022-07-19 NOTE — Telephone Encounter (Signed)
Patient is currently scheduled for 3/1 for an annual. She is not due until September. Please contact patient to cancel appt. If medication is needed please let me know and a refill can be sent in.

## 2022-07-21 ENCOUNTER — Ambulatory Visit: Payer: Self-pay | Admitting: Obstetrics and Gynecology

## 2022-07-21 DIAGNOSIS — Z79890 Hormone replacement therapy (postmenopausal): Secondary | ICD-10-CM

## 2022-07-21 DIAGNOSIS — Z01419 Encounter for gynecological examination (general) (routine) without abnormal findings: Secondary | ICD-10-CM

## 2022-07-26 ENCOUNTER — Ambulatory Visit
Admission: RE | Admit: 2022-07-26 | Discharge: 2022-07-26 | Disposition: A | Discharge status: Home / Self Care | Payer: BLUE CROSS/BLUE SHIELD | Source: Ambulatory Visit | Attending: Obstetrics and Gynecology | Admitting: Obstetrics and Gynecology

## 2022-07-26 DIAGNOSIS — Z1231 Encounter for screening mammogram for malignant neoplasm of breast: Secondary | ICD-10-CM | POA: Diagnosis not present

## 2022-08-07 ENCOUNTER — Other Ambulatory Visit: Payer: Self-pay | Admitting: Family

## 2022-08-07 DIAGNOSIS — M79641 Pain in right hand: Secondary | ICD-10-CM

## 2022-08-08 ENCOUNTER — Ambulatory Visit
Admission: RE | Admit: 2022-08-08 | Discharge: 2022-08-08 | Disposition: A | Discharge status: Home / Self Care | Payer: BLUE CROSS/BLUE SHIELD | Source: Ambulatory Visit | Attending: Family | Admitting: Family

## 2022-08-08 ENCOUNTER — Other Ambulatory Visit: Payer: Self-pay | Admitting: Family

## 2022-08-08 DIAGNOSIS — M25542 Pain in joints of left hand: Secondary | ICD-10-CM

## 2022-08-08 DIAGNOSIS — M79641 Pain in right hand: Secondary | ICD-10-CM

## 2022-10-21 ENCOUNTER — Other Ambulatory Visit: Payer: Self-pay

## 2022-10-21 ENCOUNTER — Emergency Department
Admission: EM | Admit: 2022-10-21 | Discharge: 2022-10-21 | Disposition: A | Discharge status: Home / Self Care | Payer: BLUE CROSS/BLUE SHIELD | Attending: Emergency Medicine | Admitting: Emergency Medicine

## 2022-10-21 ENCOUNTER — Emergency Department: Payer: BLUE CROSS/BLUE SHIELD

## 2022-10-21 DIAGNOSIS — S86012A Strain of left Achilles tendon, initial encounter: Secondary | ICD-10-CM | POA: Diagnosis not present

## 2022-10-21 DIAGNOSIS — X58XXXA Exposure to other specified factors, initial encounter: Secondary | ICD-10-CM | POA: Insufficient documentation

## 2022-10-21 DIAGNOSIS — M79662 Pain in left lower leg: Secondary | ICD-10-CM | POA: Diagnosis present

## 2022-10-21 DIAGNOSIS — Y9339 Activity, other involving climbing, rappelling and jumping off: Secondary | ICD-10-CM | POA: Diagnosis not present

## 2022-10-21 MED ORDER — OXYCODONE-ACETAMINOPHEN 5-325 MG PO TABS
1.0000 | ORAL_TABLET | Freq: Once | ORAL | Status: AC
  Administered 2022-10-21: 1 via ORAL
  Filled 2022-10-21: qty 1

## 2022-10-21 MED ORDER — OXYCODONE-ACETAMINOPHEN 5-325 MG PO TABS: 1.0000 | ORAL_TABLET | Freq: Four times a day (QID) | ORAL | 0 refills | Status: AC | PRN

## 2022-10-21 MED ORDER — IBUPROFEN 600 MG PO TABS
600.0000 mg | ORAL_TABLET | Freq: Once | ORAL | Status: AC
  Administered 2022-10-21: 600 mg via ORAL
  Filled 2022-10-21: qty 1

## 2022-10-21 NOTE — ED Triage Notes (Signed)
Pt states she was at a gender reveal, and when the gender was revealed, pt states she jumped in the air, and when she landed, she felt immediate pain in her achilles tendon region.  Pt states she is unable to lift her left foot, or bear weight on her left leg.  Pt is otherwise A&O x4 and in NAD in triage.

## 2022-10-21 NOTE — ED Provider Notes (Signed)
[facility] Provider Note    Event Date/Time   First MD Initiated Contact with Patient 10/21/22 2009     (approximate)   History   Leg Pain  Encounter completed with Spanish video interpreter HPI  Gabrielle Gutierrez is a 47 y.o. female with no significant past medical history who comes to the ED complaining of left calf pain which is severe and sudden onset and occurred after jumping for Joy at her daughter-in-law's gender reveal party when the sex of her future grandchild was revealed (it's a girl!).  She has severe pain with any movement of the left ankle     Physical Exam   Triage Vital Signs: ED Triage Vitals  Enc Vitals Group     BP 10/21/22 1941 129/84     Pulse Rate 10/21/22 1941 86     Resp 10/21/22 1941 16     Temp 10/21/22 1941 99 F (37.2 C)     Temp Source 10/21/22 1941 Oral     SpO2 10/21/22 1941 99 %     Weight 10/21/22 1943 195 lb (88.5 kg)     Height 10/21/22 1943 5\' 9"  (1.753 m)     Head Circumference --      Peak Flow --      Pain Score 10/21/22 1943 10     Pain Loc --      Pain Edu? --      Excl. in GC? --     Most recent vital signs: Vitals:   10/21/22 1941  BP: 129/84  Pulse: 86  Resp: 16  Temp: 99 F (37.2 C)  SpO2: 99%    General: Awake, no distress.  CV:  Good peripheral perfusion.  Normal DP pulse Resp:  Normal effort.  Abd:  No distention.  Other:  Tenderness throughout the left calf muscle belly.  Exquisite tenderness at the location of palpable defect in the Achilles tendon about 4 cm proximal to the calcaneus.  Slight passive dorsiflexion of the left ankle results in severe left calf pain.  Thompson's test indicates intact Achilles tendon function   ED Results / Procedures / Treatments   Labs (all labs ordered are listed, but only abnormal results are displayed) Labs Reviewed - No data to display   RADIOLOGY X-ray of left ankle interpreted by me, negative for fracture, does demonstrate a  partial tear of the left Achilles tendon.  Radiology report reviewed   PROCEDURES:  .Ortho Injury Treatment  Date/Time: 10/21/2022 8:44 PM  Performed by: Sharman Cheek, MD Authorized by: Sharman Cheek, MD   Consent:    Consent obtained:  Verbal   Consent given by:  Patient   Risks discussed:  Restricted joint movement and stiffness   Alternatives discussed:  No treatmentInjury location: lower leg Location details: left lower leg Injury type: soft tissue Pre-procedure neurovascular assessment: neurovascularly intact Pre-procedure distal perfusion: normal Pre-procedure neurological function: normal Pre-procedure range of motion: normal  Anesthesia: Local anesthesia used: no  Patient sedated: NoImmobilization: splint Splint type: short leg Splint Applied by: ED Nurse and ED Provider Supplies used: cotton padding, elastic bandage and Ortho-Glass Post-procedure neurovascular assessment: post-procedure neurovascularly intact Post-procedure distal perfusion: normal Post-procedure neurological function: normal Post-procedure range of motion: normal      MEDICATIONS ORDERED IN ED: Medications  oxyCODONE-acetaminophen (PERCOCET/ROXICET) 5-325 MG per tablet 1 tablet (1 tablet Oral Given 10/21/22 2039)  ibuprofen (ADVIL) tablet 600 mg (600 mg Oral Given 10/21/22 2039)     IMPRESSION / MDM /  ASSESSMENT AND PLAN / ED COURSE  I reviewed the triage vital signs and the nursing notes.                              Differential diagnosis includes, but is not limited to, ankle fracture, muscle strain, sprain, Achilles tendon tear  Patient presents with clinically apparent partial tear of the Achilles tendon.  X-ray corroborates this and is negative for fracture.  Will splint in plantarflexion, follow-up with orthopedics.  Crutches.  Counseled on RICE therapy, NSAIDs.  Will send Percocet prescription as well.     FINAL CLINICAL IMPRESSION(S) / ED DIAGNOSES   Final diagnoses:   Partial Achilles tendon tear, left, initial encounter     Rx / DC Orders   ED Discharge Orders          Ordered    oxyCODONE-acetaminophen (PERCOCET) 5-325 MG tablet  Every 6 hours PRN        10/21/22 2044             Note:  This document was prepared using Dragon voice recognition software and may include unintentional dictation errors.   Sharman Cheek, MD 10/21/22 2046

## 2022-10-25 DIAGNOSIS — S86002A Unspecified injury of left Achilles tendon, initial encounter: Secondary | ICD-10-CM | POA: Insufficient documentation

## 2023-08-15 ENCOUNTER — Encounter: Payer: Self-pay | Admitting: Family Medicine

## 2023-08-15 DIAGNOSIS — Z1231 Encounter for screening mammogram for malignant neoplasm of breast: Secondary | ICD-10-CM

## 2023-08-25 ENCOUNTER — Emergency Department: Payer: Self-pay

## 2023-08-25 ENCOUNTER — Emergency Department
Admission: EM | Admit: 2023-08-25 | Discharge: 2023-08-25 | Disposition: A | Discharge status: Home / Self Care | Payer: Self-pay | Attending: Emergency Medicine | Admitting: Emergency Medicine

## 2023-08-25 ENCOUNTER — Other Ambulatory Visit: Payer: Self-pay

## 2023-08-25 DIAGNOSIS — R1012 Left upper quadrant pain: Secondary | ICD-10-CM | POA: Insufficient documentation

## 2023-08-25 DIAGNOSIS — R079 Chest pain, unspecified: Secondary | ICD-10-CM | POA: Insufficient documentation

## 2023-08-25 LAB — COMPREHENSIVE METABOLIC PANEL WITH GFR
ALT: 20 U/L (ref 0–44)
AST: 17 U/L (ref 15–41)
Albumin: 3.5 g/dL (ref 3.5–5.0)
Alkaline Phosphatase: 69 U/L (ref 38–126)
Anion gap: 7 (ref 5–15)
BUN: 21 mg/dL — ABNORMAL HIGH (ref 6–20)
CO2: 24 mmol/L (ref 22–32)
Calcium: 8.8 mg/dL — ABNORMAL LOW (ref 8.9–10.3)
Chloride: 107 mmol/L (ref 98–111)
Creatinine, Ser: 0.53 mg/dL (ref 0.44–1.00)
GFR, Estimated: >60 mL/min (ref >60)
Glucose, Bld: 101 mg/dL — ABNORMAL HIGH (ref 70–99)
Potassium: 3.8 mmol/L (ref 3.5–5.1)
Sodium: 138 mmol/L (ref 135–145)
Total Bilirubin: 0.6 mg/dL (ref 0.0–1.2)
Total Protein: 7 g/dL (ref 6.5–8.1)

## 2023-08-25 LAB — TROPONIN I (HIGH SENSITIVITY)
Troponin I (High Sensitivity): <2 ng/L (ref <18)
Troponin I (High Sensitivity): <2 ng/L (ref <18)

## 2023-08-25 LAB — CBC
HCT: 38 % (ref 36.0–46.0)
Hemoglobin: 12.5 g/dL (ref 12.0–15.0)
MCH: 29.6 pg (ref 26.0–34.0)
MCHC: 32.9 g/dL (ref 30.0–36.0)
MCV: 90 fL (ref 80.0–100.0)
Platelets: 288 10*3/uL (ref 150–400)
RBC: 4.22 MIL/uL (ref 3.87–5.11)
RDW: 12.8 % (ref 11.5–15.5)
WBC: 5.2 10*3/uL (ref 4.0–10.5)
nRBC: 0 % (ref 0.0–0.2)

## 2023-08-25 LAB — POC URINE PREG, ED: Preg Test, Ur: NEGATIVE

## 2023-08-25 LAB — D-DIMER, QUANTITATIVE: D-Dimer, Quant: <0.27 ug{FEU}/mL (ref 0.00–0.50)

## 2023-08-25 LAB — LIPASE, BLOOD: Lipase: 30 U/L (ref 11–51)

## 2023-08-25 MED ORDER — KETOROLAC TROMETHAMINE 30 MG/ML IJ SOLN
15.0000 mg | Freq: Once | INTRAMUSCULAR | Status: AC
  Administered 2023-08-25: 15 mg via INTRAVENOUS
  Filled 2023-08-25: qty 1

## 2023-08-25 MED ORDER — DIPHENHYDRAMINE HCL 50 MG/ML IJ SOLN
12.5000 mg | INTRAMUSCULAR | Status: AC
Start: 2023-08-25 — End: 2023-08-25
  Administered 2023-08-25: 12.5 mg via INTRAVENOUS
  Filled 2023-08-25: qty 1

## 2023-08-25 MED ORDER — MORPHINE SULFATE (PF) 4 MG/ML IV SOLN
4.0000 mg | Freq: Once | INTRAVENOUS | Status: AC
  Administered 2023-08-25: 4 mg via INTRAVENOUS
  Filled 2023-08-25: qty 1

## 2023-08-25 MED ORDER — ONDANSETRON HCL 4 MG/2ML IJ SOLN
4.0000 mg | INTRAMUSCULAR | Status: AC
Start: 2023-08-25 — End: 2023-08-25
  Administered 2023-08-25: 4 mg via INTRAVENOUS
  Filled 2023-08-25: qty 2

## 2023-08-25 MED ORDER — IOHEXOL 300 MG/ML  SOLN
100.0000 mL | Freq: Once | INTRAMUSCULAR | Status: AC | PRN
  Administered 2023-08-25: 100 mL via INTRAVENOUS

## 2023-08-25 MED ORDER — DROPERIDOL 2.5 MG/ML IJ SOLN
2.5000 mg | Freq: Once | INTRAMUSCULAR | Status: AC
  Administered 2023-08-25: 2.5 mg via INTRAVENOUS
  Filled 2023-08-25: qty 2

## 2023-08-25 NOTE — ED Provider Notes (Signed)
 [facility] Provider Note    Event Date/Time   First MD Initiated Contact with Patient 08/25/23 [phone]     (approximate)   History   Chest Pain  The patient and/or family speak(s) Spanish.  They understand they have the right to the use of a hospital interpreter, however at this time they prefer to speak directly with me in Spanish.  They know that they can ask for an interpreter at any time.   HPI Gabrielle Gutierrez is a 48 y.o. female with no chronic medical issues who presents for evaluation of about 3 days of pain in the left lower part of her chest or the left upper part of her abdomen.  It is right along the line of her diaphragm and beneath the left side of her ribs.  She does not remember any specific incident that occurred that might of caused trauma to the area except that a few days ago she was leaning over something and putting some pressure on weight on that side.  She felt some pain at the time and it went away, but then about 2 days later with this current pain came back and has been severe.  It is worse when she takes deep breaths or when she moves in certain ways, particularly with her left arm more on the left side.  No nausea or vomiting.  No chest pain anywhere higher in her chest.  No history of blood clots in her legs or her lungs.  No history of heart issues or lung problems.     Physical Exam   Triage Vital Signs: ED Triage Vitals  Encounter Vitals Group     BP 08/25/23 0409 131/78     Systolic BP Percentile --      Diastolic BP Percentile --      Pulse Rate 08/25/23 0409 79     Resp 08/25/23 0409 18     Temp 08/25/23 0409 98.3 F (36.8 C)     Temp src --      SpO2 08/25/23 0409 100 %     Weight 08/25/23 0416 90.7 kg (200 lb)     Height 08/25/23 0416 1.753 m (5\' 9" )     Head Circumference --      Peak Flow --      Pain Score 08/25/23 0416 10     Pain Loc --      Pain Education --      Exclude from Growth Chart --     Most  recent vital signs: Vitals:   08/25/23 0600 08/25/23 0630  BP: 112/73 106/76  Pulse:  71  Resp: (!) 27 13  Temp:    SpO2:  97%    General: Awake, appears uncomfortable but otherwise well-appearing CV:  Good peripheral perfusion.  Regular rate and rhythm. Resp:  Normal effort. Speaking easily and comfortably, no accessory muscle usage nor intercostal retractions.   Abd:  No distention.  Patient guards to palpation of the epigastrium and left upper quadrant.  However her left lower ribs are also tender to palpation.  No tenderness to palpation of the right upper quadrant nor right lower quadrant nor periumbilical region.   ED Results / Procedures / Treatments   Labs (all labs ordered are listed, but only abnormal results are displayed) Labs Reviewed  COMPREHENSIVE METABOLIC PANEL WITH GFR - Abnormal; Notable for the following components:      Result Value   Glucose, Bld 101 (*)  BUN 21 (*)    Calcium 8.8 (*)    All other components within normal limits  CBC  D-DIMER, QUANTITATIVE  LIPASE, BLOOD  POC URINE PREG, ED  TROPONIN I (HIGH SENSITIVITY)  TROPONIN I (HIGH SENSITIVITY)     EKG  ED ECG REPORT I, Loleta Rose, the attending physician, personally viewed and interpreted this ECG.  Date: 08/25/2023 EKG Time: 4:24 AM Rate: 80 Rhythm: normal sinus rhythm QRS Axis: normal Intervals: normal ST/T Wave abnormalities: normal Narrative Interpretation: no evidence of acute ischemia    RADIOLOGY See hospital course for details regarding imaging results   PROCEDURES:  Critical Care performed: No  .1-3 Lead EKG Interpretation  Performed by: Loleta Rose, MD Authorized by: Loleta Rose, MD     Interpretation: normal     ECG rate:  75   ECG rate assessment: normal     Rhythm: sinus rhythm     Ectopy: none     Conduction: normal       IMPRESSION / MDM / ASSESSMENT AND PLAN / ED COURSE  I reviewed the triage vital signs and the nursing notes.                               Differential diagnosis includes, but is not limited to, bruise/contusion, splenic injury, PE, ACS, pancreatitis, biliary colic.  Patient's presentation is most consistent with acute presentation with potential threat to life or bodily function.  Labs/studies ordered: EKG, two-view chest x-ray, lipase, D-dimer, CBC, CMP, high-sensitivity troponin, urine pregnancy test  Interventions/Medications given:  Medications  morphine (PF) 4 MG/ML injection 4 mg (4 mg Intravenous Given 08/25/23 0515)  ondansetron (ZOFRAN) injection 4 mg (4 mg Intravenous Given 08/25/23 0515)  ketorolac (TORADOL) 30 MG/ML injection 15 mg (15 mg Intravenous Given 08/25/23 0551)  droperidol (INAPSINE) 2.5 MG/ML injection 2.5 mg (2.5 mg Intravenous Given 08/25/23 0607)  diphenhydrAMINE (BENADRYL) injection 12.5 mg (12.5 mg Intravenous Given 08/25/23 0608)  iohexol (OMNIPAQUE) 300 MG/ML solution 100 mL (100 mLs Intravenous Contrast Given 08/25/23 0609)    (Note:  hospital course my include additional interventions and/or labs/studies not listed above.)   Vital signs are stable and within normal limits.  Patient is PERC negative but PE would explain the patient's symptoms and somewhat pleuritic nature of the discomfort, although the pain seems to be more right along the line of the diaphragm or even in the upper part of her left upper quadrant of the abdomen.  No ischemic changes on EKG and I think ACS is very unlikely.  Labs are pending including a D-dimer.  Morphine 4 mg IV, Zofran 4 mg IV, and Toradol 15 mg IV for pain control and nausea prevention.  I will reassess after the labs and chest x-ray are back to determine appropriate additional evaluation.  The patient is on the cardiac monitor to evaluate for evidence of arrhythmia and/or significant heart rate changes.   Clinical Course as of 08/25/23 1610  Sat Aug 25, 2023  0514 D-Dimer, Quant: <0.27 [CF]  0514 CBC Normal CBC [CF]  0527 Preg Test, Ur: Negative  [CF]  0527 DG Chest 2 View I viewed and interpreted the patient's two-view chest x-ray and I see no evidence of pneumonia, rib fractures, nor other acute or emergent abnormality.  I also read the radiologist's report, which confirmed no acute findings. [CF]  0604 Patient is reporting no change after the morphine and Toradol.  I again suspect there  is a high anxiety component associated with the pain.  To help with the pain and as a calm agent, ordered droperidol 2.5 mg IV as well as diphenhydramine 12.5 mg IV.  Given the ongoing pain, ordered CT of the abdomen and pelvis.  Given the negative D-dimer, I think that getting a CTA chest is a very limited utility, and we should be able to see lung bases in the CT abdomen/pelvis [CF]  0647 CT ABDOMEN PELVIS W CONTRAST I viewed and interpreted the patient's CT abdomen/pelvis.  There is no indication of any traumatic injury nor free fluid in the abdomen or pulmonary abnormality in the left lung base to explain the patient's injury.  Radiology report confirms no evidence of acute abnormality.  Patient resting comfortably at this time after droperidol. [CF]  [phone] Patient feels more comfortable than before.  I updated her about the reassuring workup and she is comfortable with the plan for discharge and outpatient follow-up.  I gave the usual and customary follow-up recommendations and return precautions. [CF]    Clinical Course User Index [CF] Loleta Rose, MD     FINAL CLINICAL IMPRESSION(S) / ED DIAGNOSES   Final diagnoses:  LUQ pain     Rx / DC Orders   ED Discharge Orders     None        Note:  This document was prepared using Dragon voice recognition software and may include unintentional dictation errors.   Loleta Rose, MD 08/25/23 [phone]

## 2023-08-25 NOTE — Discharge Instructions (Signed)
 Your workup in the Emergency Department today was reassuring.  We did not find any specific abnormalities.  We recommend you drink plenty of fluids, take your regular medications and/or any new ones prescribed today, and follow up with the doctor(s) listed in these documents as recommended.  Return to the Emergency Department if you develop new or worsening symptoms that concern you. ------------------------------------------------------- Su evaluacin en Urgencias hoy fue tranquilizadora. No encontramos ninguna anomala especfica. Le recomendamos beber abundante lquido, tomar sus medicamentos habituales o los nuevos que le hayan recetado hoy, y acudir al/los mdico(s) de control indicado(s) en estos documentos, segn lo recomendado.  Regrese a Urgencias si presenta sntomas nuevos o que empeoran y [employer]

## 2023-08-25 NOTE — ED Triage Notes (Signed)
 Pt reports pain in chest behind and under left breast x3 days. Pt reports pain is worse when she takes a deep breath. Pt denies injury to area.

## 2023-10-03 ENCOUNTER — Other Ambulatory Visit: Payer: Self-pay | Admitting: Family Medicine

## 2023-10-03 DIAGNOSIS — Z1231 Encounter for screening mammogram for malignant neoplasm of breast: Secondary | ICD-10-CM

## 2023-12-28 ENCOUNTER — Encounter: Payer: Self-pay | Admitting: Emergency Medicine

## 2023-12-28 ENCOUNTER — Ambulatory Visit
Admission: EM | Admit: 2023-12-28 | Discharge: 2023-12-28 | Disposition: A | Discharge status: Home / Self Care | Payer: Self-pay | Attending: Emergency Medicine | Admitting: Emergency Medicine

## 2023-12-28 DIAGNOSIS — M5442 Lumbago with sciatica, left side: Secondary | ICD-10-CM

## 2023-12-28 LAB — POCT URINE DIPSTICK
Bilirubin, UA: NEGATIVE
Blood, UA: NEGATIVE
Glucose, UA: NEGATIVE mg/dL
Ketones, POC UA: NEGATIVE mg/dL
Leukocytes, UA: NEGATIVE
Nitrite, UA: NEGATIVE
POC PROTEIN,UA: NEGATIVE
Spec Grav, UA: 1.015 (ref 1.010–1.025)
Urobilinogen, UA: 0.2 U/dL
pH, UA: 7.5 (ref 5.0–8.0)

## 2023-12-28 LAB — POCT URINE PREGNANCY: Preg Test, Ur: NEGATIVE

## 2023-12-28 MED ORDER — METHOCARBAMOL 500 MG PO TABS: 500.0000 mg | ORAL_TABLET | Freq: Two times a day (BID) | ORAL | 0 refills | Status: AC | PRN

## 2023-12-28 MED ORDER — KETOROLAC TROMETHAMINE 30 MG/ML IJ SOLN
30.0000 mg | Freq: Once | INTRAMUSCULAR | Status: AC
  Administered 2023-12-28: 30 mg via INTRAMUSCULAR

## 2023-12-28 NOTE — Discharge Instructions (Addendum)
 You were given an injection of Toradol  today.  Do not take any over-the-counter medications for the rest of today.    Take the muscle relaxer as directed.  Do not drive, operate machinery, drink alcohol, or perform dangerous activities while taking this medication as it may cause drowsiness.  Follow-up with your primary care provider if your symptoms are not improving.

## 2023-12-28 NOTE — ED Provider Notes (Signed)
 Gabrielle Gutierrez    CSN: [id number] Arrival date & time: 12/28/23  1431      History   Chief Complaint No chief complaint on file.   HPI Gabrielle Gutierrez is a 48 y.o. female.  Patient presents with 1 day history of left lower back pain which radiates to her left buttock.  No falls or injury.  Her symptoms started while she was at work yesterday.  The pain is worse with ambulation and position change.  No numbness, weakness, saddle anesthesia, loss of bowel/bladder control, abdominal pain, dysuria, hematuria, flank pain.    The history is provided by the patient and medical records. A language interpreter was used.    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   Halitosis 12/04/2018    Past Surgical History:  Procedure Laterality Date   APPENDECTOMY     CESAREAN SECTION     2   CHOLECYSTECTOMY     COLONOSCOPY WITH PROPOFOL  N/A 04/05/2020   Procedure: COLONOSCOPY WITH PROPOFOL ;  Surgeon: Unk Corinn Skiff, MD;  Location: [facility] ENDOSCOPY;  Service: Gastroenterology;  Laterality: N/A;   ESOPHAGOGASTRODUODENOSCOPY (EGD) WITH PROPOFOL  N/A 09/14/2020   Procedure: ESOPHAGOGASTRODUODENOSCOPY (EGD) WITH PROPOFOL ;  Surgeon: Unk Corinn Skiff, MD;  Location: [facility] ENDOSCOPY;  Service: Gastroenterology;  Laterality: N/A;    OB History     Gravida  2   Para  2   Term  2   Preterm      AB      Living  2      SAB      IAB      Ectopic      Multiple      Live Births  2            Home Medications    Prior to Admission medications   Medication Sig Start Date End Date Taking? Authorizing Provider  methocarbamol  (ROBAXIN ) 500 MG tablet Take 1 tablet (500 mg total) by mouth 2 (two) times daily as needed for muscle spasms. 12/28/23  Yes Corlis Burnard DEL, NP  Levonorgestrel-Ethinyl Estradiol (AMETHIA)  0.15-0.03 &0.01 MG tablet Take 1 tablet by mouth at bedtime. 01/31/22   Janit Alm Agent, MD  multivitamin-iron-minerals-folic acid (CENTRUM) chewable tablet Chew 1 tablet by mouth daily.    [provider]    Family History Family History  Problem Relation Age of Onset   Cancer Mother    Breast cancer Mother 44   Rheum arthritis Father     Social History Social History   Tobacco Use   Smoking status: Never   Smokeless tobacco: Never  Vaping Use   Vaping status: Never Used  Substance Use Topics   Alcohol use: Yes    Comment: socially   Drug use: Never     Allergies   Patient has no known allergies.   Review of Systems Review of Systems  Constitutional:  Negative for chills and fever.  Respiratory:  Negative for cough and shortness of breath.   Cardiovascular:  Negative for chest pain and palpitations.  Gastrointestinal:  Negative for abdominal pain, constipation, diarrhea, nausea and vomiting.  Genitourinary:  Negative for dysuria, flank pain, hematuria, pelvic pain and vaginal discharge.  Musculoskeletal:  Positive for back pain and gait problem. Negative for joint swelling.  Skin:  Negative for color change,  rash and wound.  Neurological:  Negative for weakness and numbness.     Physical Exam Triage Vital Signs ED Triage Vitals [12/28/23 1455]  Encounter Vitals Group     BP 127/78     Girls Systolic BP Percentile      Girls Diastolic BP Percentile      Boys Systolic BP Percentile      Boys Diastolic BP Percentile      Pulse Rate 76     Resp 18     Temp 98 F (36.7 C)     Temp src      SpO2 96 %     Weight      Height      Head Circumference      Peak Flow      Pain Score      Pain Loc      Pain Education      Exclude from Growth Chart    No data found.  Updated Vital Signs BP 127/78   Pulse 76   Temp 98 F (36.7 C)   Resp 18   SpO2 96%   Visual Acuity Right Eye Distance:   Left Eye Distance:   Bilateral Distance:    Right  Eye Near:   Left Eye Near:    Bilateral Near:     Physical Exam Constitutional:      General: She is not in acute distress. HENT:     Mouth/Throat:     Mouth: Mucous membranes are moist.  Cardiovascular:     Rate and Rhythm: Normal rate and regular rhythm.  Pulmonary:     Effort: Pulmonary effort is normal. No respiratory distress.  Abdominal:     General: Bowel sounds are normal.     Palpations: Abdomen is soft.     Tenderness: There is no abdominal tenderness. There is no right CVA tenderness, left CVA tenderness, guarding or rebound.  Musculoskeletal:        General: No swelling, tenderness or deformity. Normal range of motion.  Skin:    General: Skin is warm and dry.     Capillary Refill: Capillary refill takes less than 2 seconds.     Findings: No bruising, erythema, lesion or rash.  Neurological:     General: No focal deficit present.     Mental Status: She is alert and oriented to person, place, and time.     Sensory: No sensory deficit.     Motor: No weakness.     Gait: Gait abnormal.     Comments: Limping gait      UC Treatments / Results  Labs (all labs ordered are listed, but only abnormal results are displayed) Labs Reviewed  POCT URINE PREGNANCY  POCT URINE DIPSTICK    EKG   Radiology No results found.  Procedures Procedures (including critical care time)  Medications Ordered in UC Medications  ketorolac  (TORADOL ) 30 MG/ML injection 30 mg (30 mg Intramuscular Given 12/28/23 1547)    Initial Impression / Assessment and Plan / UC Course  I have reviewed the triage vital signs and the nursing notes.  Pertinent labs & imaging results that were available during my care of the patient were reviewed by me and considered in my medical decision making (see chart for details).    Left lower back pain with left-sided sciatica.  Toradol  injection given here.  Instructed patient not to take any over-the-counter medication today.  Also treating with  methocarbamol ; precautions for drowsiness discussed.  Education provided  on sciatica.  Instructed her to follow-up with her PCP if she is not improving.  Work note provided per patient request.  She agrees to plan of care.  Final Clinical Impressions(s) / UC Diagnoses   Final diagnoses:  Acute left-sided low back pain with left-sided sciatica     Discharge Instructions      You were given an injection of Toradol  today.  Do not take any over-the-counter medications for the rest of today.    Take the muscle relaxer as directed.  Do not drive, operate machinery, drink alcohol, or perform dangerous activities while taking this medication as it may cause drowsiness.  Follow-up with your primary care provider if your symptoms are not improving.      ED Prescriptions     Medication Sig Dispense Auth. Provider   methocarbamol  (ROBAXIN ) 500 MG tablet Take 1 tablet (500 mg total) by mouth 2 (two) times daily as needed for muscle spasms. 10 tablet Corlis Burnard DEL, NP      PDMP not reviewed this encounter.   Corlis Burnard DEL, NP 12/28/23 [phone]

## 2023-12-31 ENCOUNTER — Emergency Department
Admission: EM | Admit: 2023-12-31 | Discharge: 2023-12-31 | Disposition: A | Discharge status: Home / Self Care | Payer: Self-pay | Attending: Emergency Medicine | Admitting: Emergency Medicine

## 2023-12-31 ENCOUNTER — Other Ambulatory Visit: Payer: Self-pay

## 2023-12-31 DIAGNOSIS — M5442 Lumbago with sciatica, left side: Secondary | ICD-10-CM | POA: Insufficient documentation

## 2023-12-31 MED ORDER — OXYCODONE-ACETAMINOPHEN 5-325 MG PO TABS: 1.0000 | ORAL_TABLET | Freq: Four times a day (QID) | ORAL | 0 refills | Status: AC | PRN

## 2023-12-31 MED ORDER — LIDOCAINE 5 % EX PTCH
1.0000 | MEDICATED_PATCH | Freq: Once | CUTANEOUS | Status: DC
  Administered 2023-12-31 (×2): 1 via TRANSDERMAL
  Filled 2023-12-31: qty 1

## 2023-12-31 MED ORDER — KETOROLAC TROMETHAMINE 30 MG/ML IJ SOLN
30.0000 mg | Freq: Once | INTRAMUSCULAR | Status: AC
  Administered 2023-12-31 (×2): 30 mg via INTRAMUSCULAR
  Filled 2023-12-31: qty 1

## 2023-12-31 MED ORDER — PREDNISONE 50 MG PO TABS: 50.0000 mg | ORAL_TABLET | Freq: Every day | ORAL | 0 refills | Status: AC

## 2023-12-31 MED ORDER — ACETAMINOPHEN 500 MG PO TABS
1000.0000 mg | ORAL_TABLET | Freq: Once | ORAL | Status: AC
  Administered 2023-12-31 (×2): 1000 mg via ORAL
  Filled 2023-12-31: qty 2

## 2023-12-31 NOTE — Discharge Instructions (Addendum)
 Necesita una cita de seguimiento con un mdico de atencin primaria. Se le indicar que acuda a un mdico especialista en columna si los sntomas persisten durante la siguiente semana. Puede usar parches de Lidoderm  de venta libre y mantenerlos durante 12 horas. Alterne Motrin  y Tylenol  y comience un esteroide. Regrese a urgencias si presenta empeoramiento de la debilidad, el entumecimiento o la incontinencia urinaria.  Prednisona: le recetaron un esteroide. Es importante que tome este medicamento con alimentos. Este medicamento puede causar Programme researcher, broadcasting/film/video. Tambin puede aumentar su glucosa si tiene antecedentes de diabetes, por lo que es importante que controle su glucosa con frecuencia mientras est tomando este medicamento.  Control de dolor: Ibuprofeno (motrin /aleve/advil ): puede tomar de 3 a 4 tabletas (600 a 800 mg) cada 6 horas segn sea necesario para el dolor o la fiebre. Acetaminofn (tylenol ): puede tomar 2 tabletas extrafuertes (1000 mg) cada 6 horas segn sea necesario para el dolor o la fiebre. Puede alternar estos medicamentos o tomarlos juntos. Asegrese de comer/beber agua mientras toma estos medicamentos.  Le recetaron analgsicos narcticos. Tmelos solo si tiene dolor intenso. Son medicamentos muy adictivos y pueden causar estreimiento. Si necesita tomar ms de [employer] dosis, comience a Acupuncturist de heces. Si sufre estreimiento, tome una tapa de MiraLAX y repita la dosis hasta que tenga evacuaciones intestinales regulares. Mantenga este medicamento fuera del alcance de los nios. No puede conducir ni trabajar mientras est tomando [employer].

## 2023-12-31 NOTE — ED Triage Notes (Signed)
 Pt went to urgent care 3 days ago for left lower back pain pt was given an injection and muscle relaxer's but has not had relief. Pt states pain is like a pinched nerve/sciatica. Pt limping.

## 2023-12-31 NOTE — ED Provider Notes (Signed)
 [facility] Provider Note    Event Date/Time   First MD Initiated Contact with Patient 12/31/23 1214     (approximate)   History   Back Pain  Formal translating service utilized HPI  [facility]ierrez is a 48 y.o. female no significant past medical history who presents to the emergency department with back pain.  Patient states that she was lifting something heavy at work on Friday and started having back pain.  Evaluated urgent care on Friday and ongoing pain since that time.  Complaining of pain to her lower back that radiates down her left leg.  Denies any urinary or bowel incontinence.  No numbness or weakness.  No prior injury to her back.  No falls or trauma.  No daily medication use.  Was given a muscle relaxer and she has been taking that into tablets of ibuprofen  every 6 hours.  No significant improvement of her pain.  No history of kidney stone.  No prior back injury or cancer.     Physical Exam   Triage Vital Signs: ED Triage Vitals [12/31/23 1126]  Encounter Vitals Group     BP 121/72     Girls Systolic BP Percentile      Girls Diastolic BP Percentile      Boys Systolic BP Percentile      Boys Diastolic BP Percentile      Pulse Rate 85     Resp 19     Temp 98 F (36.7 C)     Temp Source Oral     SpO2 99 %     Weight 200 lb (90.7 kg)     Height 5' 9 (1.753 m)     Head Circumference      Peak Flow      Pain Score 10     Pain Loc      Pain Education      Exclude from Growth Chart     Most recent vital signs: Vitals:   12/31/23 1126  BP: 121/72  Pulse: 85  Resp: 19  Temp: 98 F (36.7 C)  SpO2: 99%    Physical Exam Constitutional:      Appearance: She is well-developed.  HENT:     Head: Atraumatic.  Eyes:     Conjunctiva/sclera: Conjunctivae normal.  Cardiovascular:     Rate and Rhythm: Regular rhythm.  Pulmonary:     Effort: No respiratory distress.  Abdominal:     General: There is no distension.   Musculoskeletal:        General: Normal range of motion.     Cervical back: Normal range of motion.     Comments: Midline lower lumbar tenderness to palpation  Skin:    General: Skin is warm.     Capillary Refill: Capillary refill takes less than 2 seconds.  Neurological:     Mental Status: She is alert. Mental status is at baseline.     Comments: 5/5 strength bilateral lower extremities.  Sensation intact.  No saddle anesthesia.     IMPRESSION / MDM / ASSESSMENT AND PLAN / ED COURSE  I reviewed the triage vital signs and the nursing notes.  Differential diagnosis including sciatica, piriformis syndrome, disc herniation.  Clinical picture is not consistent with cauda equina or epidural compression syndrome.  No weakness, no upper motor neuron signs, no urinary or bowel incontinence and no numbness.  On chart review patient was evaluated in urgent care and given a muscle relaxer  LABS (all labs ordered are listed, but only abnormal results are displayed) Labs interpreted as -    Labs Reviewed - No data to display   MDM    Given IM ketorolac , Tylenol  and Lidoderm  patch while in the emergency department.  Do not feel that any imaging is necessary at this time given low suspicion for a fracture.  Does not meet criteria for an emergent MRI at this time and have a low suspicion for cauda equina or epidural compression syndrome.  Able to ambulate in the emergency department but does have a limp secondary to pain.  5/5 strength on my exam with no saddle anesthesia.  Discussed symptomatic treatment and will do a short course of steroids, Lidoderm  patch, Percocet and Tylenol /ibuprofen .  Discussed return precautions to the emergency department for any ongoing or worsening symptoms.  Discussed symptoms that would warrant emergent MRI.  Given information to follow-up as an outpatient with primary care and with spine surgery.  Discussed work restrictions and no heavy lifting, twisting or  bending   PROCEDURES:  Critical Care performed: No  Procedures  Patient's presentation is most consistent with acute complicated illness / injury requiring diagnostic workup.   MEDICATIONS ORDERED IN ED: Medications  lidocaine  (LIDODERM ) 5 % 1 patch (1 patch Transdermal Patch Applied 12/31/23 1251)  ketorolac  (TORADOL ) 30 MG/ML injection 30 mg (30 mg Intramuscular Given 12/31/23 1252)  acetaminophen  (TYLENOL ) tablet 1,000 mg (1,000 mg Oral Given 12/31/23 1251)    FINAL CLINICAL IMPRESSION(S) / ED DIAGNOSES   Final diagnoses:  Acute left-sided low back pain with left-sided sciatica     Rx / DC Orders   ED Discharge Orders          Ordered    predniSONE  (DELTASONE ) 50 MG tablet  Daily with breakfast        12/31/23 1254    oxyCODONE -acetaminophen  (PERCOCET) 5-325 MG tablet  Every 6 hours PRN        12/31/23 1254             Note:  This document was prepared using Dragon voice recognition software and may include unintentional dictation errors.   Suzanne Kirsch, MD 12/31/23 1300

## 2024-01-06 ENCOUNTER — Emergency Department
Admission: EM | Admit: 2024-01-06 | Discharge: 2024-01-06 | Disposition: A | Discharge status: Home / Self Care | Payer: Self-pay | Attending: Emergency Medicine | Admitting: Emergency Medicine

## 2024-01-06 ENCOUNTER — Other Ambulatory Visit: Payer: Self-pay

## 2024-01-06 ENCOUNTER — Emergency Department: Payer: Self-pay

## 2024-01-06 DIAGNOSIS — R1032 Left lower quadrant pain: Secondary | ICD-10-CM

## 2024-01-06 DIAGNOSIS — K625 Hemorrhage of anus and rectum: Secondary | ICD-10-CM

## 2024-01-06 DIAGNOSIS — K529 Noninfective gastroenteritis and colitis, unspecified: Secondary | ICD-10-CM | POA: Insufficient documentation

## 2024-01-06 DIAGNOSIS — R9389 Abnormal findings on diagnostic imaging of other specified body structures: Secondary | ICD-10-CM | POA: Insufficient documentation

## 2024-01-06 LAB — URINALYSIS, ROUTINE W REFLEX MICROSCOPIC
Bilirubin Urine: NEGATIVE
Glucose, UA: NEGATIVE mg/dL
Ketones, ur: NEGATIVE mg/dL
Leukocytes,Ua: NEGATIVE
Nitrite: NEGATIVE
Protein, ur: NEGATIVE mg/dL
Specific Gravity, Urine: 1.021 (ref 1.005–1.030)
pH: 7 (ref 5.0–8.0)

## 2024-01-06 LAB — COMPREHENSIVE METABOLIC PANEL WITH GFR
ALT: 40 U/L (ref 0–44)
AST: 23 U/L (ref 15–41)
Albumin: 4 g/dL (ref 3.5–5.0)
Alkaline Phosphatase: 72 U/L (ref 38–126)
Anion gap: 12 (ref 5–15)
BUN: 16 mg/dL (ref 6–20)
CO2: 23 mmol/L (ref 22–32)
Calcium: 9.5 mg/dL (ref 8.9–10.3)
Chloride: 103 mmol/L (ref 98–111)
Creatinine, Ser: 0.41 mg/dL — ABNORMAL LOW (ref 0.44–1.00)
GFR, Estimated: >60 mL/min (ref >60)
Glucose, Bld: 98 mg/dL (ref 70–99)
Potassium: 4 mmol/L (ref 3.5–5.1)
Sodium: 138 mmol/L (ref 135–145)
Total Bilirubin: 0.9 mg/dL (ref 0.0–1.2)
Total Protein: 7.8 g/dL (ref 6.5–8.1)

## 2024-01-06 LAB — CBC
HCT: 42.2 % (ref 36.0–46.0)
Hemoglobin: 14.1 g/dL (ref 12.0–15.0)
MCH: 28.7 pg (ref 26.0–34.0)
MCHC: 33.4 g/dL (ref 30.0–36.0)
MCV: 85.9 fL (ref 80.0–100.0)
Platelets: 310 K/uL (ref 150–400)
RBC: 4.91 MIL/uL (ref 3.87–5.11)
RDW: 13.2 % (ref 11.5–15.5)
WBC: 9.5 K/uL (ref 4.0–10.5)
nRBC: 0 % (ref 0.0–0.2)

## 2024-01-06 LAB — LIPASE, BLOOD: Lipase: 29 U/L (ref 11–51)

## 2024-01-06 MED ORDER — KETOROLAC TROMETHAMINE 15 MG/ML IJ SOLN
15.0000 mg | Freq: Once | INTRAMUSCULAR | Status: AC
  Administered 2024-01-06: 15 mg via INTRAVENOUS
  Filled 2024-01-06: qty 1

## 2024-01-06 MED ORDER — ONDANSETRON HCL 4 MG/2ML IJ SOLN
4.0000 mg | Freq: Once | INTRAMUSCULAR | Status: AC
  Administered 2024-01-06: 4 mg via INTRAVENOUS
  Filled 2024-01-06: qty 2

## 2024-01-06 MED ORDER — IOHEXOL 300 MG/ML  SOLN
100.0000 mL | Freq: Once | INTRAMUSCULAR | Status: AC | PRN
  Administered 2024-01-06: 100 mL via INTRAVENOUS

## 2024-01-06 MED ORDER — SODIUM CHLORIDE 0.9 % IV BOLUS
1000.0000 mL | Freq: Once | INTRAVENOUS | Status: AC
  Administered 2024-01-06: 1000 mL via INTRAVENOUS

## 2024-01-06 MED ORDER — FENTANYL CITRATE PF 50 MCG/ML IJ SOSY
50.0000 ug | PREFILLED_SYRINGE | Freq: Once | INTRAMUSCULAR | Status: AC
  Administered 2024-01-06: 50 ug via INTRAVENOUS
  Filled 2024-01-06: qty 1

## 2024-01-06 MED ORDER — MORPHINE SULFATE (PF) 4 MG/ML IV SOLN: 4.0000 mg | Freq: Once | INTRAVENOUS | Status: DC

## 2024-01-06 NOTE — ED Triage Notes (Signed)
 Pt arrives with c/o lower ABD pain that started a week ago. Pt reports nausea and fatigue. Pt reports blood in her stool that started yesterday.

## 2024-01-06 NOTE — Discharge Instructions (Addendum)
 You are seen in the emergency department for bloody diarrhea and lower abdominal pain.  You had a CT scan that showed questionable findings of colitis which is inflammation of your colon.  It is important to stay hydrated and drink plenty of fluids.  You had an incidental finding of a thickened endometrial stripe, you need follow-up with gynecology so they can do an ultrasound and further workup.  You are given information and a referral for primary care follow-up.  Return to the emergency department if you have any worsening pain, lightheadedness or worsening bleeding.  Acude a urgencias por diarrea con sangre y dolor abdominal bajo. Se le realiz una tomografa computarizada que mostr hallazgos dudosos de colitis, una inflamacin del colon. Es importante mantenerse hidratada y beber abundante lquido. Se le detect accidentalmente un engrosamiento de la banda endometrial; necesita una cita de seguimiento con ginecologa para que le realicen una ecografa y otras pruebas diagnsticas. Se le proporciona informacin y se le deriva a un seguimiento de Marine scientist. Regrese a Building control surveyor, el mareo o el sangrado [place].

## 2024-01-06 NOTE — ED Provider Notes (Signed)
 [facility] Provider Note    Event Date/Time   First MD Initiated Contact with Patient 01/06/24 1143     (approximate)   History   Abdominal Pain  Formal translating service utilized HPI  Gab[facility]elle Gutierrez is a 48 y.o. female no significant past medical history presents to the emergency department with lower abdominal pain with rectal bleeding.  Patient states that she was recently evaluated the emergency department for symptoms of sciatica and started on multiple new medications.  States that she has been having ongoing pain from her sciatica but now is also having a new pain.  States that she is having pain to her left lower abdomen and feeling like she needs to have a bowel movement.  When she did go to have a bowel movement she had multiple episodes of bright red blood.  Denies any similar episodes in the past.  Not on any anticoagulation.  Denies any upper abdominal pain.  Endorses mild nausea but episodes of vomiting.  No prior abdominal surgery.  No recent travel.  No known sick contacts.  She is uncertain of her last menstrual cycle.  Does not have a primary care physician or gynecologist.  Denies any chest pain or shortness of breath.  No swelling to her legs.  Denies any lower extremity weakness or numbness.  No urinary or bowel incontinence.     Physical Exam   Triage Vital Signs: ED Triage Vitals  Encounter Vitals Group     BP 01/06/24 1110 100/85     Girls Systolic BP Percentile --      Girls Diastolic BP Percentile --      Boys Systolic BP Percentile --      Boys Diastolic BP Percentile --      Pulse Rate 01/06/24 1110 99     Resp 01/06/24 1110 17     Temp 01/06/24 1110 98.2 F (36.8 C)     Temp Source 01/06/24 1110 Oral     SpO2 01/06/24 1110 100 %     Weight 01/06/24 1109 200 lb (90.7 kg)     Height --      Head Circumference --      Peak Flow --      Pain Score 01/06/24 1111 7     Pain Loc --      Pain Education --      Exclude  from Growth Chart --     Most recent vital signs: Vitals:   01/06/24 1110  BP: 100/85  Pulse: 99  Resp: 17  Temp: 98.2 F (36.8 C)  SpO2: 100%    Physical Exam Constitutional:      Appearance: She is well-developed.  HENT:     Head: Atraumatic.  Eyes:     Conjunctiva/sclera: Conjunctivae normal.  Cardiovascular:     Rate and Rhythm: Regular rhythm.  Pulmonary:     Effort: No respiratory distress.     Breath sounds: Normal breath sounds.  Abdominal:     General: There is no distension.     Tenderness: There is abdominal tenderness in the left lower quadrant. There is no guarding or rebound.  Musculoskeletal:        General: Normal range of motion.     Cervical back: Normal range of motion.  Skin:    General: Skin is warm.     Capillary Refill: Capillary refill takes less than 2 seconds.  Neurological:     General: No focal deficit present.  Mental Status: She is alert. Mental status is at baseline.     IMPRESSION / MDM / ASSESSMENT AND PLAN / ED COURSE  I reviewed the triage vital signs and the nursing notes.  Differential diagnosis including diverticulitis, infectious diarrhea, anemia, significant upper GI bleed, electrolyte abnormality, dehydration  Patient given IV fluids, antiemetics and pain medication    RADIOLOGY CT scan abdomen and pelvis with contrast read as questionable mild descending colitis limited due to colonic underdistention.  Thickened endometrium.  No other acute intra-abdominal pathology.  Status post cholecystectomy.  Mild atherosclerotic plaque.   LABS (all labs ordered are listed, but only abnormal results are displayed) Labs interpreted as -    Labs Reviewed  COMPREHENSIVE METABOLIC PANEL WITH GFR - Abnormal; Notable for the following components:      Result Value   Creatinine, Ser 0.41 (*)    All other components within normal limits  URINALYSIS, ROUTINE W REFLEX MICROSCOPIC - Abnormal; Notable for the following components:    Color, Urine YELLOW (*)    APPearance HAZY (*)    Hgb urine dipstick SMALL (*)    Bacteria, UA RARE (*)    All other components within normal limits  LIPASE, BLOOD  CBC     MDM  On reevaluation patient states she is feeling much better.  No longer with nausea and no longer having any bloody bowel movements or pain with bowel movements.  Denies any dysuria, urinary urgency or frequency.  Discussed incidental finding with the patient of thickened endometrium and the need to follow-up as an outpatient with gynecology for ultrasound and possible endometrial biopsy.  I have a low suspicion for significant upper GI bleed, normal BUN, no upper abdominal pain although she has been on steroids recently, no significant anemia and not on anticoagulation.  Most likely with colitis.  Will hold on any antibiotics at this time.  Discussed symptomatic treatment.  Given information to follow-up as an outpatient with gynecology and primary care.  Discussed return to the emergency department for any ongoing or worsening symptoms.  Lab work overall reassuring with no significant leukocytosis or anemia.  Creatinine at baseline.  No significant electrolyte abnormality.     PROCEDURES:  Critical Care performed: No  Procedures  Patient's presentation is most consistent with acute presentation with potential threat to life or bodily function.   MEDICATIONS ORDERED IN ED: Medications  sodium chloride  0.9 % bolus 1,000 mL (0 mLs Intravenous Stopped 01/06/24 1532)  ondansetron  (ZOFRAN ) injection 4 mg (4 mg Intravenous Given 01/06/24 1221)  ketorolac  (TORADOL ) 15 MG/ML injection 15 mg (15 mg Intravenous Given 01/06/24 1221)  fentaNYL  (SUBLIMAZE ) injection 50 mcg (50 mcg Intravenous Given 01/06/24 1222)  iohexol  (OMNIPAQUE ) 300 MG/ML solution 100 mL (100 mLs Intravenous Contrast Given 01/06/24 1316)    FINAL CLINICAL IMPRESSION(S) / ED DIAGNOSES   Final diagnoses:  Left lower quadrant abdominal pain   Increased endometrial stripe thickness  BRBPR (bright red blood per rectum)  Colitis     Rx / DC Orders   ED Discharge Orders          Ordered    Ambulatory Referral to Primary Care (Establish Care)        01/06/24 1509             Note:  This document was prepared using Dragon voice recognition software and may include unintentional dictation errors.   Suzanne Kirsch, MD 01/06/24 1701

## 2024-01-07 ENCOUNTER — Telehealth: Payer: Self-pay

## 2024-01-07 NOTE — Telephone Encounter (Signed)
 Patient came in office asking if an appointment can be made ASAP with Dr. Janit. She was at ER yesterday, had an MRI done and something was seen in uterus. She is scared because she has releatives with breast and vaginal cancer.

## 2024-01-21 DIAGNOSIS — Z9189 Other specified personal risk factors, not elsewhere classified: Secondary | ICD-10-CM

## 2024-01-21 DIAGNOSIS — Z803 Family history of malignant neoplasm of breast: Secondary | ICD-10-CM

## 2024-01-21 DIAGNOSIS — Z1371 Encounter for nonprocreative screening for genetic disease carrier status: Secondary | ICD-10-CM

## 2024-01-21 HISTORY — DX: Other specified personal risk factors, not elsewhere classified: Z91.89

## 2024-01-21 HISTORY — DX: Encounter for nonprocreative screening for genetic disease carrier status: Z13.71

## 2024-01-21 HISTORY — DX: Family history of malignant neoplasm of breast: Z80.3

## 2024-01-22 NOTE — Progress Notes (Unsigned)
    GYNECOLOGY PROGRESS NOTE  Subjective:  PCP: [facility] Yancy Roof, MD  Patient ID: Gab[facility]elle Gutierrez, female    DOB: 30-May-1975, 48 y.o.   MRN: [id number]  This encounter was facilitated by live Spanish language interpreter [facility].   HPI  Patient is a 48 y.o. G58P2002 female who presents for thickened endometrium.  She went to [facility] on 01/06/24 for lower abdominal pain and rectal bleeding and CT scan showed thickened endometrium. She denies vaginal bleeding, pt's LMP sometime in 2019, denies any PMB since that time. She started combined OCPs [facility]) with Dr. Janit in 02/2019 for hot flashes and took them off and on until 01/2023.  Has not taken for the past year and did not have any bleeding when she stopped taking them. Has not taken any other form of hormone replacement.  Her mother has breast cancer and she is unsure of BRCA status of her mother, is open to testing herself today. Part of why she stopped the OCPs was her mother told her it would increase her risk of breast cancer.   Last pap: 01/31/22, NILM HPV neg Hx of abnormal pap: yes, several years ago, requiring biopsy in [place] normal paps since Last mammogram: 07/26/22, BIRADS-1  The following portions of the patient's history were reviewed and updated as appropriate: allergies, current medications, past family history, past medical history, past social history, past surgical history, and problem list.  Review of Systems Pertinent items are noted in HPI.   Objective:   Blood pressure 139/82, pulse 84, height 5' 9 (1.753 m), weight 203 lb 8 oz (92.3 kg), last menstrual period 09/19/2017. Body mass index is 30.05 kg/m.  General appearance: alert, cooperative, and mildly obese Abdomen: soft, non-tender; bowel sounds normal; no masses,  no organomegaly Pelvic: cervix normal in appearance, external genitalia normal, no adnexal masses or tenderness, no cervical motion tenderness, rectovaginal septum normal, uterus  normal size, shape, and consistency, and vagina normal without discharge Extremities: extremities normal, atraumatic, no cyanosis or edema Neurologic: Grossly normal  CT Scan 01/08/24 IMPRESSION: 1. Question developing mild descending colitis with markedly limited evaluation due to colonic under distension. 2. Thickened endometrium.  Correlate with menstrual cycle. 3. Otherwise no acute intra-abdominal or intrapelvic abnormality. 4. Status post cholecystectomy. 5. Mild atherosclerotic plaque.  Assessment/Plan:   1. Thickened endometrium   2. Family history of breast cancer in mother    61 y.o. G2P2002 , post-menopausal at 48yo and has taken OCP off and on for the past 74yrs, without PMB, and thickened endometrium incidentally seen on CT scan done in ED for abd pain. Her mother also has breast cancer and pt would like BRCA testing today.  -Discussed need for pelvic US , ordered, and possible EMB based on US  findings -Pt very anxious today about her other cancer screenings, invited her to return for her annual WWE and we will address health maintenance at that time. She will return when able for her annual.  -BRCA testing ordered today    Total time was 35 minutes. That includes chart review before the visit, the actual patient visit, and time spent on documentation after the visit. Time excludes procedures, if any.    Estil Mangle, DO [facility]

## 2024-01-24 ENCOUNTER — Ambulatory Visit: Payer: Self-pay | Admitting: Obstetrics

## 2024-01-24 ENCOUNTER — Encounter: Payer: Self-pay | Admitting: Obstetrics

## 2024-01-24 VITALS — BP 139/82 | HR 84 | Ht 69.0 in | Wt 203.5 lb

## 2024-01-24 DIAGNOSIS — R9389 Abnormal findings on diagnostic imaging of other specified body structures: Secondary | ICD-10-CM

## 2024-01-24 DIAGNOSIS — Z803 Family history of malignant neoplasm of breast: Secondary | ICD-10-CM

## 2024-01-25 DIAGNOSIS — R9389 Abnormal findings on diagnostic imaging of other specified body structures: Secondary | ICD-10-CM | POA: Insufficient documentation

## 2024-01-25 DIAGNOSIS — Z803 Family history of malignant neoplasm of breast: Secondary | ICD-10-CM | POA: Insufficient documentation

## 2024-01-25 HISTORY — DX: Abnormal findings on diagnostic imaging of other specified body structures: R93.89

## 2024-01-28 ENCOUNTER — Ambulatory Visit
Admission: RE | Admit: 2024-01-28 | Discharge: 2024-01-28 | Disposition: A | Discharge status: Home / Self Care | Source: Ambulatory Visit | Attending: Obstetrics | Admitting: Obstetrics

## 2024-01-28 DIAGNOSIS — R9389 Abnormal findings on diagnostic imaging of other specified body structures: Secondary | ICD-10-CM | POA: Insufficient documentation

## 2024-02-04 ENCOUNTER — Ambulatory Visit: Payer: Self-pay | Admitting: Obstetrics

## 2024-02-18 NOTE — Progress Notes (Deleted)
 ANNUAL PREVENTATIVE CARE GYNECOLOGY  ENCOUNTER NOTE  SUBJECTIVE:       Gabrielle Gutierrez is a 48 y.o. G34P2002 female here for a routine annual gynecologic exam. The patient {is/is not/has never been:13135} sexually active. The patient is not taking hormone replacement therapy. Patient denies post-menopausal vaginal bleeding. Family history of breast, uterine, ovarian cancer: yes. The patient wears seatbelts: {yes/no:311178}. The patient participates in regular exercise: {yes/no/not asked:9010}. Has the patient ever been transfused or tattooed?: {yes/no/not asked:9010}. The patient reports that there {is/is not:9024} domestic violence in her life. Has the patient completed the Gardasil vaccine? {yes/no:311178}.  Current complaints: 1.  ***    Gynecologic History Patient's last menstrual period was 09/19/2017 (approximate). Contraception: post menopausal status Last Pap: 01/31/2022. Results were: normal History of abnormal pap: [place] History of STIs: *** Last Mammogram: 07/28/2022. Results were: normal Last Colonoscopy: 04/05/20 Last Dexa Scan: n/a  PHQ-2:     04/05/2021   10:10 AM  Depression screen PHQ 2/9  Decreased Interest 0  Down, Depressed, Hopeless 0  PHQ - 2 Score 0    Obstetric History OB History  Gravida Para Term Preterm AB Living  2 2 2   2   SAB IAB Ectopic Multiple Live Births      2    # Outcome Date GA Lbr Len/2nd Weight Sex Type Anes PTL Lv  2 Term 09/11/06    F CS-Unspec   LIV  1 Term 12/14/98    M CS-Unspec   LIV    Past Medical History:  Diagnosis Date   Spina bifida (HCC)     Family History  Problem Relation Age of Onset   Cancer Mother    Breast cancer Mother 62   Rheum arthritis Father    Vaginal cancer Paternal Grandmother     Past Surgical History:  Procedure Laterality Date   APPENDECTOMY     CESAREAN SECTION     2   CHOLECYSTECTOMY     COLONOSCOPY WITH PROPOFOL  N/A 04/05/2020   Procedure: COLONOSCOPY WITH  PROPOFOL ;  Surgeon: Unk Corinn Skiff, MD;  Location: [facility] ENDOSCOPY;  Service: Gastroenterology;  Laterality: N/A;   ESOPHAGOGASTRODUODENOSCOPY (EGD) WITH PROPOFOL  N/A 09/14/2020   Procedure: ESOPHAGOGASTRODUODENOSCOPY (EGD) WITH PROPOFOL ;  Surgeon: Unk Corinn Skiff, MD;  Location: [facility] ENDOSCOPY;  Service: Gastroenterology;  Laterality: N/A;    Social History   Socioeconomic History   Marital status: Married    Spouse name: Not on file   Number of children: Not on file   Years of education: Not on file   Highest education level: Not on file  Occupational History   Not on file  Tobacco Use   Smoking status: Never   Smokeless tobacco: Never  Vaping Use   Vaping status: Never Used  Substance and Sexual Activity   Alcohol use: Yes    Comment: socially   Drug use: Never   Sexual activity: Yes    Birth control/protection: Pill  Other Topics Concern   Not on file  Social History Narrative   Not on file   Social Drivers of Health   Financial Resource Strain: Not on file  Food Insecurity: Not on file  Transportation Needs: Not on file  Physical Activity: Not on file  Stress: Not on file  Social Connections: Unknown (09/26/2021)   [facility]   Social Network    Social Network: Not on file  Intimate Partner Violence: Unknown (08/26/2021)   Received from [facility]  HITS    Physically Hurt: Not on file    Insult or Talk Down To: Not on file    Threaten Physical Harm: Not on file    Scream or Curse: Not on file    Current Outpatient Medications on File Prior to Visit  Medication Sig Dispense Refill   Levonorgestrel-Ethinyl Estradiol (AMETHIA) 0.15-0.03 &0.01 MG tablet Take 1 tablet by mouth at bedtime. (Patient not taking: Reported on 01/24/2024) 84 tablet 3   methocarbamol  (ROBAXIN ) 500 MG tablet Take 1 tablet (500 mg total) by mouth 2 (two) times daily as needed for muscle spasms. (Patient not taking: Reported on 01/24/2024) 10 tablet 0    multivitamin-iron-minerals-folic acid (CENTRUM) chewable tablet Chew 1 tablet by mouth daily. (Patient not taking: Reported on 01/24/2024)     No current facility-administered medications on file prior to visit.    No Known Allergies   Review of Systems ROS Review of Systems - General ROS: negative for - chills, fatigue, fever, hot flashes, night sweats, weight gain or weight loss Psychological ROS: negative for - anxiety, decreased libido, depression, mood swings, physical abuse or sexual abuse Ophthalmic ROS: negative for - blurry vision, eye pain or loss of vision ENT ROS: negative for - headaches, hearing change, visual changes or vocal changes Allergy and Immunology ROS: negative for - hives, itchy/watery eyes or seasonal allergies Hematological and Lymphatic ROS: negative for - bleeding problems, bruising, swollen lymph nodes or weight loss Endocrine ROS: negative for - galactorrhea, hair pattern changes, hot flashes, malaise/lethargy, mood swings, palpitations, polydipsia/polyuria, skin changes, temperature intolerance or unexpected weight changes Breast ROS: negative for - new or changing breast lumps or nipple discharge Respiratory ROS: negative for - cough or shortness of breath Cardiovascular ROS: negative for - chest pain, irregular heartbeat, palpitations or shortness of breath Gastrointestinal ROS: no abdominal pain, change in bowel habits, or black or bloody stools Genito-Urinary ROS: no dysuria, trouble voiding, or hematuria Musculoskeletal ROS: negative for - joint pain or joint stiffness Neurological ROS: negative for - bowel and bladder control changes Dermatological ROS: negative for rash and skin lesion changes   OBJECTIVE:   LMP 09/19/2017 (Approximate)   CONSTITUTIONAL: Well-developed, well-nourished female in no acute distress.  PSYCHIATRIC: Normal mood and affect. Normal behavior. Normal judgment and thought content. NEUROLGIC: Alert and oriented to person,  place, and time. Normal muscle tone coordination. No cranial nerve deficit noted. HENT:  Normocephalic, atraumatic, External right and left ear normal. Oropharynx is clear and moist EYES: Conjunctivae and EOM are normal. No scleral icterus.  NECK: Normal range of motion, supple, no masses.  Normal thyroid.  SKIN: Skin is warm and dry. No rash noted. Not diaphoretic. No erythema. No pallor. CARDIOVASCULAR: Normal heart rate noted, regular rhythm, no murmur. RESPIRATORY: Clear to auscultation bilaterally. Effort and breath sounds normal, no problems with respiration noted. BREASTS: Symmetric in size. No masses, skin changes, nipple drainage, or lymphadenopathy. ABDOMEN: Soft, normal bowel sounds, no distention noted.  No tenderness, rebound or guarding.  PELVIC:  Bladder {:311640}  Urethra: {:311719}  Vulva: {:311722}  Vagina: {:311643}  Cervix: {:311644}  Uterus: {:311718}  Adnexa: {:311645}  RV: {Blank multiple:19196::External Exam NormaI,No Rectal Masses,Normal Sphincter tone}  MUSCULOSKELETAL: Normal range of motion. No tenderness.  No cyanosis, clubbing, or edema.  2+ distal pulses. LYMPHATIC: No Axillary, Supraclavicular, or Inguinal Adenopathy.  Labs: Lab Results  Component Value Date   WBC 9.5 01/06/2024   HGB 14.1 01/06/2024   HCT 42.2 01/06/2024   MCV 85.9 01/06/2024  PLT 310 01/06/2024    Lab Results  Component Value Date   CREATININE 0.41 (L) 01/06/2024   BUN 16 01/06/2024   NA 138 01/06/2024   K 4.0 01/06/2024   CL 103 01/06/2024   CO2 23 01/06/2024    Lab Results  Component Value Date   ALT 40 01/06/2024   AST 23 01/06/2024   ALKPHOS 72 01/06/2024   BILITOT 0.9 01/06/2024    Lab Results  Component Value Date   CHOL 236 (H) 01/28/2019   HDL 47 01/28/2019   LDLCALC 152 (H) 01/28/2019   TRIG 205 (H) 01/28/2019   CHOLHDL 5.0 (H) 01/28/2019    Lab Results  Component Value Date   TSH 3.460 01/28/2019    Lab Results  Component Value Date    HGBA1C 5.3 01/28/2019     ASSESSMENT:   No diagnosis found.   PLAN:   Robyn Galati is a 48 y.o. G50P2002 female here today for her annual exam, doing well.  Pap: done with cotesting today Mammogram: ordered***due *** Colon: PCP *** ordered colonoscopy***Cologuard -OR- due *** Labs: ***A1C, CMP, HepC, Lipid panel, Vit D, TSH PHQ-2 = ***, discussed coping techniques; RTC if worsens or develops concern Contraception: *** Healthy lifestyle modifications discussed: multivitamin, diet, exercise, sunscreen, tobacco and alcohol use. Emphasized importance of regular physical activity.  Calcium and Vit D recommendation reviewed.  All questions answered to patient's satisfaction.   Follow up 1 yr for annual, sooner prn.    Estil Mangle, DO [facility] at [facility]

## 2024-02-19 ENCOUNTER — Encounter: Payer: Self-pay | Admitting: Obstetrics and Gynecology

## 2024-02-19 ENCOUNTER — Encounter: Admitting: Obstetrics

## 2024-02-20 ENCOUNTER — Other Ambulatory Visit (HOSPITAL_COMMUNITY)
Admission: RE | Admit: 2024-02-20 | Discharge: 2024-02-20 | Disposition: A | Source: Ambulatory Visit | Attending: Obstetrics | Admitting: Obstetrics

## 2024-02-20 ENCOUNTER — Other Ambulatory Visit (HOSPITAL_COMMUNITY)
Admission: RE | Admit: 2024-02-20 | Discharge: 2024-02-20 | Disposition: A | Discharge status: Home / Self Care | Source: Ambulatory Visit | Attending: Obstetrics | Admitting: Obstetrics

## 2024-02-20 ENCOUNTER — Ambulatory Visit (INDEPENDENT_AMBULATORY_CARE_PROVIDER_SITE_OTHER): Admitting: Obstetrics

## 2024-02-20 VITALS — BP 117/66 | HR 79 | Wt 204.2 lb

## 2024-02-20 DIAGNOSIS — Z01411 Encounter for gynecological examination (general) (routine) with abnormal findings: Secondary | ICD-10-CM

## 2024-02-20 DIAGNOSIS — Z113 Encounter for screening for infections with a predominantly sexual mode of transmission: Secondary | ICD-10-CM

## 2024-02-20 DIAGNOSIS — R9389 Abnormal findings on diagnostic imaging of other specified body structures: Secondary | ICD-10-CM | POA: Diagnosis present

## 2024-02-20 DIAGNOSIS — Z01419 Encounter for gynecological examination (general) (routine) without abnormal findings: Secondary | ICD-10-CM

## 2024-02-20 DIAGNOSIS — Z9189 Other specified personal risk factors, not elsewhere classified: Secondary | ICD-10-CM

## 2024-02-20 DIAGNOSIS — D259 Leiomyoma of uterus, unspecified: Secondary | ICD-10-CM

## 2024-02-20 DIAGNOSIS — N898 Other specified noninflammatory disorders of vagina: Secondary | ICD-10-CM | POA: Diagnosis not present

## 2024-02-20 DIAGNOSIS — N951 Menopausal and female climacteric states: Secondary | ICD-10-CM | POA: Diagnosis not present

## 2024-02-20 DIAGNOSIS — Z1231 Encounter for screening mammogram for malignant neoplasm of breast: Secondary | ICD-10-CM

## 2024-02-20 MED ORDER — VEOZAH 45 MG PO TABS: 1.0000 | ORAL_TABLET | Freq: Every day | ORAL | 3 refills | Status: DC

## 2024-02-20 NOTE — Patient Instructions (Addendum)
 Prueba de Papanicolaou: qu debe saber Pap Test: What to Know Por qu me debo realizar esta prueba? La prueba de Papanicolaou, tambin denominada citologa vaginal, es una prueba con la que se detectan signos de lo siguiente: Infeccin. Cncer de cuello uterino. El cuello uterino es la parte ms baja del tero. Cambios precancerosos. Se trata de cambios que pueden ser un signo de cncer. Las mujeres deben realizarse esta prueba con regularidad. En general, debe realizarse una prueba de Papanicolaou cada 3 aos hasta llegar a la menopausia o [employer] 65 aos. Si tiene entre 30 y 60 aos, puede optar por Public librarian prueba de Papanicolaou al mismo tiempo que la prueba del virus del papiloma humano (VPH) cada 5 aos en lugar de cada 3 aos. El mdico puede recomendarle que se realice pruebas de Papanicolaou con ms o menos frecuencia en funcin de las afecciones mdicas y los resultados de la prueba de Papanicolaou anterior. Qu se analiza? Las clulas cervicales se analizan para detectar signos de infeccin o anormalidades. Qu tipo de [place] se toma?  El mdico recolectar una muestra de clulas de la superficie del cuello uterino. Esto se har con un pequeo hisopo de algodn, una esptula de plstico o un cepillo que se inserta en la vagina con una herramienta llamada espculo. Esta muestra se suele recolectar durante un examen plvico, mientras usted est recostada boca arriba sobre la camilla con los pies en los descansos para pies denominados estribos. En algunos casos, tambin pueden recolectarse fluidos (secreciones) del cuello uterino y la vagina. Cmo debo prepararme para este anlisis? Sepa en qu etapa del ciclo menstrual se encuentra. Es posible que se le pida que vuelva a Charity fundraiser la prueba si est Magazine features editor en que debe Futures trader. Si el da en que debe realizarse la prueba tiene una infeccin vaginal aparente, deber reprogramar la prueba. Siga las instrucciones  del mdico indicadas a continuacin: Cambie o suspenda los medicamentos que toma de [place] habitual. Algunos medicamentos, como los medicamentos vaginales y Regulatory affairs officer, pueden provocar resultados anormales de la prueba. Evite hacerse duchas vaginales 2 o 3 das antes, o el da de la prueba. Informe al [employer] sobre lo siguiente: Cualquier alergia que tenga. Todos los [employer] toma. Esto incluye vitaminas, hierbas medicinales, gotas oftlmicas y cremas. Cualquier problema de sangrado que tenga. Cirugas a las que se haya sometido. Cualquier problema mdico que tenga. Embarazo (confirmado o posible). Cmo se informan los resultados? Los [employer] de la prueba se informarn como anormales o normales. Qu significan los resultados? Un resultado normal de la prueba significa que no tiene signos de cncer de cuello uterino. Un resultado anormal puede significar lo siguiente: Tiene cncer. Una prueba de Papanicolaou por s sola no es suficiente para Consulting civil engineer. Se le realizarn ms pruebas si se sospecha la presencia de cncer. Cambios precancerosos en el cuello uterino. Inflamacin del cuello uterino. Una infeccin de transmisin sexual (ITS). Infecciones por hongos. Una infeccin por un parsito. Hable con el mdico sobre el significado de [place]. Es posible que deba hacerse ms anlisis. Preguntas para hacerle al mdico Consulte al mdico o pregunte en el departamento donde se realiza el [employer] lo siguiente: Cundo estarn listos mis resultados? Cmo obtendr mis resultados? Cules son las opciones de tratamiento? Qu otras pruebas debo hacerme? Cules son los prximos pasos que debo seguir? Esta informacin no tiene Theme park manager el consejo del mdico. Asegrese de hacerle al mdico cualquier pregunta que tenga. Document Revised: 08/13/2023 Document Reviewed:  08/13/2023 Elsevier Patient Education  2025 Elsevier Inc. Biopsia de  endometrio Endometrial Biopsy  La biopsia de endometrio es un procedimiento en el que se extrae ignacia jubilee de tejido del revestimiento del tero. Este revestimiento se denomina endometrio. Luego, la [place] de tejido se enva a un laboratorio para su anlisis. Es posible que le realicen este tipo de biopsia para revisar lo siguiente: Cncer. Infeccin. Crecimientos llamados plipos. Sangrado uterino que no tiene explicacin. Informe al mdico acerca de lo siguiente: Cualquier alergia que tenga. Todos los [employer] usa , incluidas vitaminas, hierbas, gotas oftlmicas, cremas y [address] de [address]. Cualquier problema que usted o los [employer] de su familia hayan tenido con el uso de anestesia. Cualquier problema de sangrado que tenga. Cirugas a las que se haya sometido. Cualquier problema mdico que tenga. Si est embarazada o podra estarlo. Cules son los riesgos? El mdico hablar con usted [employer]. Pueden incluir: Sangrado. Infeccin. Reacciones alrgicas a los medicamentos. Dao en la pared del tero. Es poco frecuente. Qu ocurre antes del procedimiento? Lleve un registro de su perodo menstrual. Es posible que deba realizarse esta biopsia cuando no tenga el perodo menstrual. Consulte a su mdico si debe o no hacer lo siguiente: Cambiar o suspender los medicamentos que usa  habitualmente. Estos incluyen cualquier medicamento para la diabetes o anticoagulantes que use. Tomar medicamentos como aspirina e ibuprofeno. Estos medicamentos pueden tener un efecto anticoagulante en la [place]. No los tome, a menos que se lo indique el mdico. Usar medicamentos de venta libre, vitaminas, hierbas y suplementos. Lleve una toallita sanitaria. Es posible que deba usar una despus de la biopsia. Haga que un adulto responsable la lleve a su casa desde el hospital o la clnica. No se le permitir conducir. Qu ocurre durante el procedimiento? Le colocarn un instrumento  en la vagina para Restaurant manager, fast food. Esto ayuda al mdico a ver el cuello uterino. El cuello uterino es la parte ms baja del tero. Le limpiarn el cuello uterino con una solucin que [employer]. Le administrarn anestesia. Esta impide que usted Stage manager. Adormecer el cuello uterino. Usarn un instrumento llamado frceps para mantener el cuello uterino estable. Introducirn una herramienta delgada llamada sonda uterina a travs del cuello uterino. Esta se usar para lo siguiente: Programme researcher, broadcasting/film/video del tero. Buscar Immunologist de donde tomar la [place]. Le introducirn un tubo blando llamado catter dentro del tero. El catter extraer ignacia jubilee de tejido. Se retirarn el tubo y los instrumentos. La muestra se enviar a un laboratorio para su anlisis. El procedimiento puede variar segn el mdico y el hospital. Michelina ocurre despus del procedimiento? Le controlarn la presin arterial, la frecuencia cardaca, la frecuencia respiratoria y Air cabin crew de oxgeno en la sangre hasta que le den el alta del hospital o la clnica. Es su responsabilidad retirar [employer] del procedimiento. Pregunte al mdico, o a un integrante del personal del departamento donde se realice el procedimiento, cundo estarn [employer]. Esta informacin no tiene Theme park manager el consejo del mdico. Asegrese de hacerle al mdico cualquier pregunta que tenga. Document Revised: 09/12/2022 Document Reviewed: 09/12/2022 Elsevier Patient Education  2024 [employer].

## 2024-02-20 NOTE — Progress Notes (Signed)
 ANNUAL PREVENTATIVE CARE GYNECOLOGY  ENCOUNTER NOTE  SUBJECTIVE:       Gabrielle Gutierrez is a 48 y.o. [phone] female here for a routine annual gynecologic exam. The patient is sexually active. The patient is not taking hormone replacement therapy. Patient denies post-menopausal vaginal bleeding. Family history of breast, uterine, ovarian cancer: yes. The patient wears seatbelts: yes. The patient participates in regular exercise: yes. Has the patient ever been transfused or tattooed?: yes. The patient reports that there is not domestic violence in her life. Has the patient completed the Gardasil vaccine? no.  Current complaints: Thickened endometrium -- confirmed on TVUS 01/28/24 after outside CT showed. EMT 10.62mm. Pt prepared for EMB today.  2.  Low libido & hot flashes, wanting to start hormone replacement 3. Discuss Myriad MyRisk results from last visit   Gynecologic History Patient's last menstrual period was 09/19/2017 (approximate). Contraception: post menopausal status Last Pap: 01/31/2022. Results were: normal History of abnormal pap: [place] History of STIs: none Last Mammogram: 07/28/2022. Results were: normal Last Colonoscopy: 04/05/20 Last Dexa Scan: n/a  PHQ-2:     04/05/2021   10:10 AM  Depression screen PHQ 2/9  Decreased Interest 0  Down, Depressed, Hopeless 0  PHQ - 2 Score 0    Obstetric History OB History  Gravida Para Term Preterm AB Living  2 2 2   2   SAB IAB Ectopic Multiple Live Births      2    # Outcome Date GA Lbr Len/2nd Weight Sex Type Anes PTL Lv  2 Term 09/11/06    F CS-Unspec   LIV  1 Term 12/14/98    M CS-Unspec   LIV    Past Medical History:  Diagnosis Date   BRCA negative 01/2024   MyRisk neg   Family history of breast cancer 01/2024   Increased risk of breast cancer 01/2024   IBIS=19.3%/riskscore=24.0%   Spina bifida (HCC)     Family History  Problem Relation Age of Onset   Cancer Mother    Breast cancer  Mother 48   Rheum arthritis Father    Vaginal cancer Paternal Grandmother     Past Surgical History:  Procedure Laterality Date   APPENDECTOMY     CESAREAN SECTION     2   CHOLECYSTECTOMY     COLONOSCOPY WITH PROPOFOL  N/A 04/05/2020   Procedure: COLONOSCOPY WITH PROPOFOL ;  Surgeon: Unk Corinn Skiff, MD;  Location: [facility] ENDOSCOPY;  Service: Gastroenterology;  Laterality: N/A;   ESOPHAGOGASTRODUODENOSCOPY (EGD) WITH PROPOFOL  N/A 09/14/2020   Procedure: ESOPHAGOGASTRODUODENOSCOPY (EGD) WITH PROPOFOL ;  Surgeon: Unk Corinn Skiff, MD;  Location: [facility] ENDOSCOPY;  Service: Gastroenterology;  Laterality: N/A;    Social History   Socioeconomic History   Marital status: Married    Spouse name: Not on file   Number of children: Not on file   Years of education: Not on file   Highest education level: Not on file  Occupational History   Not on file  Tobacco Use   Smoking status: Never   Smokeless tobacco: Never  Vaping Use   Vaping status: Never Used  Substance and Sexual Activity   Alcohol use: Yes    Comment: socially   Drug use: Never   Sexual activity: Yes    Birth control/protection: Pill  Other Topics Concern   Not on file  Social History Narrative   Not on file   Social Drivers of Health   Financial Resource Strain: Not on file  Food Insecurity: Not on file  Transportation Needs: Not on file  Physical Activity: Not on file  Stress: Not on file  Social Connections: Unknown (09/26/2021)   Received from [facility]   Social Network    Social Network: Not on file  Intimate Partner Violence: Unknown (08/26/2021)   Received from [facility]   HITS    Physically Hurt: Not on file    Insult or Talk Down To: Not on file    Threaten Physical Harm: Not on file    Scream or Curse: Not on file    Current Outpatient Medications on File Prior to Visit  Medication Sig Dispense Refill   Levonorgestrel-Ethinyl Estradiol (AMETHIA) 0.15-0.03 &0.01 MG tablet Take 1 tablet by  mouth at bedtime. (Patient not taking: Reported on 02/20/2024) 84 tablet 3   methocarbamol  (ROBAXIN ) 500 MG tablet Take 1 tablet (500 mg total) by mouth 2 (two) times daily as needed for muscle spasms. (Patient not taking: Reported on 02/20/2024) 10 tablet 0   multivitamin-iron-minerals-folic acid (CENTRUM) chewable tablet Chew 1 tablet by mouth daily. (Patient not taking: Reported on 02/20/2024)     No current facility-administered medications on file prior to visit.    No Known Allergies   Review of Systems ROS Review of Systems - General ROS: negative for - chills, fatigue, fever, hot flashes, night sweats, weight gain or weight loss Psychological ROS: negative for - anxiety, decreased libido, depression, mood swings, physical abuse or sexual abuse Ophthalmic ROS: negative for - blurry vision, eye pain or loss of vision ENT ROS: negative for - headaches, hearing change, visual changes or vocal changes Allergy and Immunology ROS: negative for - hives, itchy/watery eyes or seasonal allergies Hematological and Lymphatic ROS: negative for - bleeding problems, bruising, swollen lymph nodes or weight loss Endocrine ROS: negative for - galactorrhea, hair pattern changes, hot flashes, malaise/lethargy, mood swings, palpitations, polydipsia/polyuria, skin changes, temperature intolerance or unexpected weight changes Breast ROS: negative for - new or changing breast lumps or nipple discharge Respiratory ROS: negative for - cough or shortness of breath Cardiovascular ROS: negative for - chest pain, irregular heartbeat, palpitations or shortness of breath Gastrointestinal ROS: no abdominal pain, change in bowel habits, or black or bloody stools Genito-Urinary ROS: no dysuria, trouble voiding, or hematuria Musculoskeletal ROS: negative for - joint pain or joint stiffness Neurological ROS: negative for - bowel and bladder control changes Dermatological ROS: negative for rash and skin lesion changes    OBJECTIVE:   BP 117/66   Pulse 79   Wt 204 lb 3.2 oz (92.6 kg)   LMP 09/19/2017 (Approximate)   BMI 30.16 kg/m   CONSTITUTIONAL: Well-developed, well-nourished female in no acute distress.  PSYCHIATRIC: Normal mood and affect. Normal behavior. Normal judgment and thought content. NEUROLGIC: Alert and oriented to person, place, and time. Normal muscle tone coordination. No cranial nerve deficit noted. HENT:  Normocephalic, atraumatic, External right and left ear normal. Oropharynx is clear and moist EYES: Conjunctivae and EOM are normal. No scleral icterus.  NECK: Normal range of motion, supple, no masses.  Normal thyroid.  SKIN: Skin is warm and dry. No rash noted. Not diaphoretic. No erythema. No pallor. CARDIOVASCULAR: Normal heart rate noted, regular rhythm, no murmur. RESPIRATORY: Clear to auscultation bilaterally. Effort and breath sounds normal, no problems with respiration noted. BREASTS: Symmetric in size. No masses, skin changes, nipple drainage, or lymphadenopathy. ABDOMEN: Soft, normal bowel sounds, no distention noted.  No tenderness, rebound or guarding.  PELVIC:  Bladder no bladder  distension noted  Urethra: normal appearing urethra with no masses, tenderness or lesions  Vulva: normal appearing vulva with no masses, tenderness or lesions  Vagina: normal appearing vagina with normal color and discharge, no lesions  Cervix: normal appearing cervix without discharge or lesions  Uterus: uterus is normal size, shape, consistency and nontender  Adnexa: normal adnexa in size, nontender and no masses  RV: External Exam NormaI, No Rectal Masses, and Normal Sphincter tone  MUSCULOSKELETAL: Normal range of motion. No tenderness.  No cyanosis, clubbing, or edema.  2+ distal pulses. LYMPHATIC: No Axillary, Supraclavicular, or Inguinal Adenopathy.  Endometrial Biopsy Procedure Note The patient was positioned on the exam table in the dorsal lithotomy position. Bimanual exam  confirmed uterine position and size. A Graves speculum was placed into the vagina. A single toothed tenaculum was placed onto the anterior lip of the cervix. The pipette was placed into the endocervical canal and advanced to the uterine fundus. Using a piston like technique, with vacuum created by withdrawing the stylus, the endometrial specimen was obtained and transferred to the biopsy container. Minimal bleeding encountered. The procedure was well tolerated.   Uterine Position: mid   Uterine Length: 7cm   Uterine Specimen: Scant   Labs: Lab Results  Component Value Date   WBC 9.5 01/06/2024   HGB 14.1 01/06/2024   HCT 42.2 01/06/2024   MCV 85.9 01/06/2024   PLT 310 01/06/2024    Lab Results  Component Value Date   CREATININE 0.41 (L) 01/06/2024   BUN 16 01/06/2024   NA 138 01/06/2024   K 4.0 01/06/2024   CL 103 01/06/2024   CO2 23 01/06/2024    Lab Results  Component Value Date   ALT 40 01/06/2024   AST 23 01/06/2024   ALKPHOS 72 01/06/2024   BILITOT 0.9 01/06/2024    Lab Results  Component Value Date   CHOL 236 (H) 01/28/2019   HDL 47 01/28/2019   LDLCALC 152 (H) 01/28/2019   TRIG 205 (H) 01/28/2019   CHOLHDL 5.0 (H) 01/28/2019    Lab Results  Component Value Date   TSH 3.460 01/28/2019    Lab Results  Component Value Date   HGBA1C 5.3 01/28/2019     ASSESSMENT:   1. Well woman exam   2. Increased risk of breast cancer   3. Thickened endometrium   4. Hot flashes due to menopause   5. Encounter for screening mammogram for malignant neoplasm of breast      PLAN:   Gabrielle Gutierrez is a 48 y.o. [phone] female here today for her annual exam, doing well.  Pap: done with cotesting today, GCCTTV with BV/yeast for itching Mammogram: ordered, has 24% risk of breast cancer,  Colon: PCP  Labs: PCP PHQ-2 = 0, discussed coping techniques; RTC if worsens or develops concern Contraception: post-menopausal Healthy lifestyle modifications discussed:  multivitamin, diet, exercise, sunscreen, tobacco and alcohol use. Emphasized importance of regular physical activity.  Calcium and Vit D recommendation reviewed.  All questions answered to patient's satisfaction.   Increased risk of breast cancer:  -Myriad MyRisk results: negative for genetics, but has increased 24% lifetime risk -Reviewed increased screening recommendations and pt would like to do mammogram alternating with breast MRI every 6mos. Discussed that MRI may not be covered by insurance and may have OOP cost, pt accepts this financially and still desires MRI yearly.  -Mammogram ordered for now, MRI for 6 mos  Thickened endometrium:  -Seen on outside CT 01/06/24 and confirmed with  AOB TVUS 01/28/24, EMT 10.4mm. She is without PMB and on OCP as HRT. EMB performed today as above without complication and sent to pathology  Fibroid:  -1.2cm intramural left lower uterine segment. Is asymptomatic and we will proceed with expectant management.   Hot flashes:  -Do not want to start higher HRT than her OCPs prior to EMB results.  -Rx for [facility] sent, CMP today and Q4mos for first year; coupon card provided, aware may not be covered by insurance. No samples avail from clinic today.    Follow up 2-3 weeks for EMB results and to discuss HRT in setting of her increased risk of breast cancer; 1 yr for annual, sooner prn.    Total time was 50 minutes. That includes chart review before the visit, the actual patient visit, and time spent on documentation after the visit. Time excludes procedures, if any.     Estil Mangle, DO [facility] at [facility]

## 2024-02-21 LAB — COMPREHENSIVE METABOLIC PANEL WITH GFR
ALT: 19 IU/L (ref 0–32)
AST: 15 IU/L (ref 0–40)
Albumin: 4.3 g/dL (ref 3.9–4.9)
Alkaline Phosphatase: 93 IU/L (ref 41–116)
BUN/Creatinine Ratio: 31 — ABNORMAL HIGH (ref 9–23)
BUN: 17 mg/dL (ref 6–24)
Bilirubin Total: 0.3 mg/dL (ref 0.0–1.2)
CO2: 21 mmol/L (ref 20–29)
Calcium: 9.4 mg/dL (ref 8.7–10.2)
Chloride: 104 mmol/L (ref 96–106)
Creatinine, Ser: 0.54 mg/dL — ABNORMAL LOW (ref 0.57–1.00)
Globulin, Total: 3 g/dL (ref 1.5–4.5)
Glucose: 90 mg/dL (ref 70–99)
Potassium: 4.2 mmol/L (ref 3.5–5.2)
Sodium: 140 mmol/L (ref 134–144)
Total Protein: 7.3 g/dL (ref 6.0–8.5)
eGFR: 114 mL/min/1.73 (ref >59)

## 2024-02-22 ENCOUNTER — Ambulatory Visit: Payer: Self-pay | Admitting: Obstetrics

## 2024-02-22 LAB — SURGICAL PATHOLOGY

## 2024-02-22 LAB — CERVICOVAGINAL ANCILLARY ONLY
Bacterial Vaginitis (gardnerella): NEGATIVE
Candida Glabrata: NEGATIVE
Candida Vaginitis: NEGATIVE
Comment: NEGATIVE
Comment: NEGATIVE
Comment: NEGATIVE

## 2024-02-25 LAB — CYTOLOGY - PAP
Adequacy: ABSENT
Chlamydia: NEGATIVE
Comment: NEGATIVE
Comment: NEGATIVE
Comment: NEGATIVE
Comment: NORMAL
Diagnosis: NEGATIVE
High risk HPV: NEGATIVE
Neisseria Gonorrhea: NEGATIVE
Trichomonas: NEGATIVE

## 2024-03-03 NOTE — Progress Notes (Signed)
    GYNECOLOGY PROGRESS NOTE  Subjective:  PCP: Ernie Yancy Roof, MD  Patient ID: Gabrielle Gutierrez, female    DOB: 1975-07-18, 48 y.o.   MRN: [id number]  This encounter was facilitated by live Spanish language interpreter [name].   HPI  Patient is a 48 y.o. [id number] who presents to go over EMB results from 02/20/2024 and discuss HRT.  She has a 24% increased risk of breast cancer based on her [facility] Myriad scoring and an extensive hx of breast/uterine cancers in her family. Prefers to stay off HRT due to this, insurance denied Veozah for hot flashes.   She underwent EMB 02/20/24 for thickened EMT 10.66mm, has been postmenopausal since 2019.   The following portions of the patient's history were reviewed and updated as appropriate: allergies, current medications, past family history, past medical history, past social history, past surgical history, and problem list.  Review of Systems Pertinent items are noted in HPI.   Objective:   Blood pressure (!) 126/57, pulse 75, weight 206 lb 14.4 oz (93.8 kg), last menstrual period 09/19/2017. Body mass index is 30.55 kg/m.  General appearance: alert and cooperative Abdomen: soft, non-tender; bowel sounds normal; no masses,  no organomegaly Pelvic: deferred Extremities: extremities normal, atraumatic, no cyanosis or edema Neurologic: Grossly normal   PATHOLOGY Endometrial Biopsy 02/21/24 A. ENDOMETRIUM, BIOPSY:  Benign inactive endometrial epithelium  Focal stromal breakdown and reactive metaplasia compatible with bleeding  Negative for polyp, atypia, hyperplasia and carcinoma   Assessment/Plan:   1. Hot flashes due to menopause   2. Thickened endometrium   3. Increased risk of breast cancer    We discussed pt's increased risk of breast cancer, FMH, and shared decision to avoid HRT. Unable to obtain Veozah due to insurance. We discussed SSRI/SNRI trial and vasomotor symptoms and pt would like to trial.   -Rx for Effexor 37.5  -- SE and dosing reviewed; recommend 3 mos minimum, can increase dose  after 4-6wks if indicated.  Thickened endometrium: EMB negative and w/o PMB; discussed how this is reassuring. Pt strongly desiring D&C to thin and clean out for peace of mind.  Requesting procedure 04/07/24 Pre-op orders placed, pre-procedure instructions reviewed, NPO after midnight Plan for post-op 2-3 wks afterward.  Increased risk of breast cancer: pt inquiring about prophylactic double mastectomy. Will investigate options and come back to patient with findings.    Total time was 35 minutes. That includes chart review before the visit, the actual patient visit, and time spent on documentation after the visit. Time excludes procedures, if any.     Estil Mangle, DO  [facility]

## 2024-03-05 ENCOUNTER — Encounter: Payer: Self-pay | Admitting: Obstetrics

## 2024-03-05 ENCOUNTER — Ambulatory Visit (INDEPENDENT_AMBULATORY_CARE_PROVIDER_SITE_OTHER): Admitting: Obstetrics

## 2024-03-05 VITALS — BP 126/57 | HR 75 | Wt 206.9 lb

## 2024-03-05 DIAGNOSIS — Z9189 Other specified personal risk factors, not elsewhere classified: Secondary | ICD-10-CM | POA: Diagnosis not present

## 2024-03-05 DIAGNOSIS — Z01818 Encounter for other preprocedural examination: Secondary | ICD-10-CM

## 2024-03-05 DIAGNOSIS — R9389 Abnormal findings on diagnostic imaging of other specified body structures: Secondary | ICD-10-CM | POA: Diagnosis not present

## 2024-03-05 DIAGNOSIS — N951 Menopausal and female climacteric states: Secondary | ICD-10-CM | POA: Diagnosis not present

## 2024-03-05 MED ORDER — VENLAFAXINE HCL ER 37.5 MG PO CP24: 37.5000 mg | ORAL_CAPSULE | Freq: Every day | ORAL | 3 refills | Status: AC

## 2024-03-19 ENCOUNTER — Encounter

## 2024-04-02 ENCOUNTER — Encounter
Admission: RE | Admit: 2024-04-02 | Discharge: 2024-04-02 | Disposition: A | Discharge status: Home / Self Care | Source: Ambulatory Visit | Attending: Obstetrics | Admitting: Obstetrics

## 2024-04-02 ENCOUNTER — Telehealth: Payer: Self-pay

## 2024-04-02 ENCOUNTER — Other Ambulatory Visit: Payer: Self-pay

## 2024-04-02 NOTE — Patient Instructions (Addendum)
 Your procedure is scheduled on: [facility] 04/07/14 Su procedimiento est programado para: Report to Day Surgery. Presntese a: To find out your arrival time please call [phone] between 1PM - 3PM on 04/04/24. Para saber su hora de llegada por favor llame al [phone] entre la 1PM - 3PM el da:   Remember: Instructions that are not followed completely may result in serious medical risk, up to and including death,  or upon the discretion of your surgeon and anesthesiologist your surgery may need to be rescheduled.  Recuerde: Las instrucciones que no se siguen completamente armed forces logistics/support/administrative officer en un riesgo de salud grave, incluyendo hasta  la [place] o a discrecin de su cirujano y scientific laboratory technician, su ciruga se puede posponer.   __X_ 1.Do not eat food after midnight the night before your procedure. No    gum chewing or hard candies. You may drink clear liquids up to 2 hours     before you are scheduled to arrive for your surgery- DO not drink clear     Liquids within 2 hours of the start of your surgery.     Clear Liquids include:    water, apple juice without pulp, clear carbohydrate drink such as    Clearfast of [place], Black Coffee or Tea (Do not add anything to coffee or tea).      No coma nada despus de la medianoche de la noche anterior a su    procedimiento. No coma chicles ni caramelos duros. Puede tomar    lquidos claros hasta 2 horas antes de su hora programada de llegada al     hospital para su procedimiento. No tome lquidos claros durante el     transcurso de las 2 horas de su llegada programada al hospital para su     procedimiento, ya que esto puede llevar a que su procedimiento se    retrase o tenga que volver a programarse.  Los lquidos claros incluyen:          - Agua o jugo de [place] sin pulpa          - Bebidas claras con carbohidratos como ClearFast o Gatorade          - Caf negro o t claro (sin leche, sin cremas, no agregue nada al caf ni al t)  No tome  nada que no est en esta lista.  Los pacientes con diabetes tipo 1 y tipo 2 solo deben printmaker.  Llame a la clnica de PreCare o a la unidad de Same Day Surgery si  tiene alguna pregunta sobre estas instrucciones.   2.Do Not Smoke or use e-cigarettes For 24 Hours Prior to Your Surgery.   Do not use any chewable tobacco products for at least 6 hours prior to surgery.  No fume ni use cigarrillos electrnicos durante las 24 horas previas a su ciruga.  No use ningn producto de tabaco masticable durante al menos 6 horas antes de la ciruga.    3. No alcohol for 24 hours before or after surgery.    No tome alcohol durante las 24 horas antes ni despus de la ciruga.  4. Bring all medications with you on the day of surgery if instructed.    Lleve todos los medicamentos con usted el da de su ciruga si se le ha indicado as.  5. Notify your doctor if there is any change in your medical condition (cold,fever, infections).    Informe a su mdico si hay algn cambio en su condicin mdica (  resfriado, fiebre, infecciones).   Do not wear jewelry, make-up, hairpins, clips or nail polish.  No use joyas, maquillajes, pinzas/ganchos para el cabello ni esmalte de uas.  Do not wear lotions, powders, or perfumes. You may wear deodorant.  No use lociones, polvos o perfumes.  Puede usar desodorante.    Do not shave 48 hours prior to surgery. Men may shave face and neck.  No se afeite 48 horas antes de la ciruga.  Los hombres pueden commercial metals company cara  y el cuello.   Do not bring valuables to the hospital.   No lleve objetos de valor al hospital.  [facility] is not responsible for any belongings or valuables.  [place] no se hace responsable de ningn tipo de pertenencias u objetos de valor.               Contacts, dentures or bridgework may not be worn into surgery.  Los lentes de [place], las dentaduras postizas o puentes no se pueden usar en la ciruga.   Leave your suitcase in the car. After  surgery it may be brought to your room.  Deje su maleta en el auto.  Despus de la ciruga podr traerla a su habitacin.   For patients admitted to the hospital, discharge time is determined by your  treatment team.  Para los pacientes que sean ingresados al hospital, el tiempo en el cual se le  dar de alta es determinado por su equipo de [place].   Patients discharged the day of surgery will not be allowed to drive home. A los pacientes que se les da de alta el mismo da de la ciruga no se les permitir conducir a higher education careers adviser.  ____ Take these medicines the morning of surgery with A SIP OF WATER:          [place] medicinas la maana de la ciruga con UN SORBO DE AGUA:  1. none  ____ Stop Anti-inflammatories on 04/02/24          Deje de tomar antiinflamatorios el da:   ____ Stop supplements until after surgery            Deje de tomar suplementos hasta despus de la ciruga  ____ Bring C-Pap to the hospital          Lleve el C-Pap al hospital   Your procedure is scheduled on: 04/07/24 Report to the Registration Desk on the 1st floor of the Medical Mall. To find out your arrival time, please call [phone] between 1PM - 3PM on: 04/04/24 If your arrival time is 6:00 am, do not arrive before that time as the [facility] entrance doors do not open until 6:00 am.  REMEMBER: Instructions that are not followed completely may result in serious medical risk, up to and including death; or upon the discretion of your surgeon and anesthesiologist your surgery may need to be rescheduled.  Do not eat food after midnight the night before surgery.  No gum chewing or hard candies.  You may however, drink CLEAR liquids up to 2 hours before you are scheduled to arrive for your surgery. Do not drink anything within 2 hours of your scheduled arrival time.  Clear liquids include: - water  - apple juice without pulp - gatorade (not RED colors) - black coffee or tea (Do NOT add milk or  creamers to the coffee or tea) Do NOT drink anything that is not on this list.  One week prior to surgery: Stop Anti-inflammatories (NSAIDS)  such as Advil , Aleve, Ibuprofen , Motrin , Naproxen, Naprosyn and Aspirin based products such as Excedrin, Goody's Powder, BC Powder. Stop ANY OVER THE COUNTER supplements until after surgery.  You may however, continue to take Tylenol  if needed for pain up until the day of surgery.  Continue taking all of your other prescription medications up until the day of surgery.  ON THE DAY OF SURGERY ONLY TAKE THESE MEDICATIONS WITH SIPS OF WATER:  NONE  Use inhalers on the day of surgery and bring to the hospital.  No Alcohol for 24 hours before or after surgery.  No Smoking including e-cigarettes for 24 hours before surgery.  No chewable tobacco products for at least 6 hours before surgery.  No nicotine patches on the day of surgery.  Do not use any recreational drugs for at least a week (preferably 2 weeks) before your surgery.  Please be advised that the combination of cocaine and anesthesia may have negative outcomes, up to and including death. If you test positive for cocaine, your surgery will be cancelled.  On the morning of surgery brush your teeth with toothpaste and water, you may rinse your mouth with mouthwash if you wish. Do not swallow any toothpaste or mouthwash.  Use CHG Soap or wipes as directed on instruction sheet.  Do not wear jewelry, make-up, hairpins, clips or nail polish.  For welded (permanent) jewelry: bracelets, anklets, waist bands, etc.  Please have this removed prior to surgery.  If it is not removed, there is a chance that hospital personnel will need to cut it off on the day of surgery.  Do not wear lotions, powders, or perfumes.   Do not shave body hair from the neck down 48 hours before surgery.  Contact lenses, hearing aids and dentures may not be worn into surgery.  Do not bring valuables to the hospital.  [facility] is not responsible for any missing/lost belongings or valuables.   Bring your C-PAP to the hospital in case you may have to spend the night.   Notify your doctor if there is any change in your medical condition (cold, fever, infection).  Wear comfortable clothing (specific to your surgery type) to the hospital.  After surgery, you can help prevent lung complications by doing breathing exercises.  Take deep breaths and cough every 1-2 hours. Your doctor may order a device called an Incentive Spirometer to help you take deep breaths.  If you are being discharged the day of surgery, you will not be allowed to drive home. You will need a responsible individual to drive you home and stay with you for 24 hours after surgery.   If you are taking public transportation, you will need to have a responsible individual with you.  Please call the Pre-admissions Testing Dept. at [phone] if you have any questions about these instructions.  Surgery Visitation Policy:  Patients having surgery or a procedure may have two visitors.  Children under the age of 57 must have an adult with them who is not the patient.  Merchandiser, Retail to address health-related social needs:  [email or web address]

## 2024-04-02 NOTE — Telephone Encounter (Signed)
 Gabrielle Gutierrez came by the office stating her employer needed a note saying what she's having, I typed a letter for her stating she was having surgery on 11/17 she's having Dilatation and curettage/hysteroscopy she was asking how long should she be out of work because her work requires heavy lifting. What do you recommended Dr. Leigh?

## 2024-04-03 NOTE — Telephone Encounter (Signed)
 Called patient, # in chart not working #? Tried husband and he stated he will send her a msg so she can call office.

## 2024-04-03 NOTE — Telephone Encounter (Signed)
 Patient called back and is aware. Stated she was taking off one week.

## 2024-04-07 ENCOUNTER — Other Ambulatory Visit: Payer: Self-pay | Admitting: Obstetrics

## 2024-04-07 ENCOUNTER — Encounter: Payer: Self-pay | Admitting: Obstetrics

## 2024-04-07 ENCOUNTER — Ambulatory Visit
Admission: RE | Admit: 2024-04-07 | Discharge: 2024-04-07 | Disposition: A | Discharge status: Home / Self Care | Attending: Obstetrics | Admitting: Obstetrics

## 2024-04-07 ENCOUNTER — Ambulatory Visit

## 2024-04-07 ENCOUNTER — Encounter
Admission: RE | Disposition: A | Discharge status: Home / Self Care | Payer: Self-pay | Source: Home / Self Care | Attending: Obstetrics

## 2024-04-07 ENCOUNTER — Other Ambulatory Visit: Payer: Self-pay

## 2024-04-07 DIAGNOSIS — A630 Anogenital (venereal) warts: Secondary | ICD-10-CM | POA: Insufficient documentation

## 2024-04-07 DIAGNOSIS — Z8049 Family history of malignant neoplasm of other genital organs: Secondary | ICD-10-CM | POA: Diagnosis not present

## 2024-04-07 DIAGNOSIS — F32A Depression, unspecified: Secondary | ICD-10-CM | POA: Diagnosis not present

## 2024-04-07 DIAGNOSIS — N949 Unspecified condition associated with female genital organs and menstrual cycle: Secondary | ICD-10-CM | POA: Insufficient documentation

## 2024-04-07 DIAGNOSIS — R9389 Abnormal findings on diagnostic imaging of other specified body structures: Secondary | ICD-10-CM | POA: Diagnosis present

## 2024-04-07 DIAGNOSIS — N888 Other specified noninflammatory disorders of cervix uteri: Secondary | ICD-10-CM | POA: Diagnosis not present

## 2024-04-07 DIAGNOSIS — Z780 Asymptomatic menopausal state: Secondary | ICD-10-CM | POA: Insufficient documentation

## 2024-04-07 HISTORY — PX: HYSTEROSCOPY WITH D & C: SHX1775

## 2024-04-07 HISTORY — PX: LESION DESTRUCTION: SHX5132

## 2024-04-07 SURGERY — DILATATION AND CURETTAGE /HYSTEROSCOPY
Anesthesia: General | Site: Vulva

## 2024-04-07 MED ORDER — HYDROCODONE-ACETAMINOPHEN 5-325 MG PO TABS
ORAL_TABLET | ORAL | Status: AC
  Filled 2024-04-07: qty 1

## 2024-04-07 MED ORDER — HYDROCODONE-ACETAMINOPHEN 5-325 MG PO TABS
1.0000 | ORAL_TABLET | Freq: Once | ORAL | Status: AC | PRN
  Administered 2024-04-07: 1 via ORAL

## 2024-04-07 MED ORDER — OXYCODONE-ACETAMINOPHEN 5-325 MG PO TABS: 1.0000 | ORAL_TABLET | Freq: Four times a day (QID) | ORAL | 0 refills | Status: AC | PRN

## 2024-04-07 MED ORDER — LACTATED RINGERS IV SOLN: INTRAVENOUS | Status: DC

## 2024-04-07 MED ORDER — PROPOFOL 500 MG/50ML IV EMUL
INTRAVENOUS | Status: DC | PRN
  Administered 2024-04-07: 150 ug/kg/min via INTRAVENOUS
  Administered 2024-04-07: 50 mg via INTRAVENOUS

## 2024-04-07 MED ORDER — CHLORHEXIDINE GLUCONATE 0.12 % MT SOLN
15.0000 mL | Freq: Once | OROMUCOSAL | Status: AC
  Administered 2024-04-07: 15 mL via OROMUCOSAL

## 2024-04-07 MED ORDER — PROPOFOL 1000 MG/100ML IV EMUL
INTRAVENOUS | Status: AC
  Filled 2024-04-07: qty 100

## 2024-04-07 MED ORDER — ACETAMINOPHEN 500 MG PO TABS
ORAL_TABLET | ORAL | Status: AC
  Filled 2024-04-07: qty 1

## 2024-04-07 MED ORDER — IBUPROFEN 800 MG PO TABS: 800.0000 mg | ORAL_TABLET | Freq: Three times a day (TID) | ORAL | 0 refills | Status: AC | PRN

## 2024-04-07 MED ORDER — BACITRACIN ZINC 500 UNIT/GM EX OINT
TOPICAL_OINTMENT | CUTANEOUS | Status: DC | PRN
  Administered 2024-04-07: 1 via TOPICAL

## 2024-04-07 MED ORDER — LIDOCAINE HCL (CARDIAC) PF 100 MG/5ML IV SOSY
PREFILLED_SYRINGE | INTRAVENOUS | Status: DC | PRN
  Administered 2024-04-07: 100 mg via INTRAVENOUS

## 2024-04-07 MED ORDER — ORAL CARE MOUTH RINSE
15.0000 mL | Freq: Once | OROMUCOSAL | Status: AC
Start: 2024-04-07 — End: 2024-04-07

## 2024-04-07 MED ORDER — MIDAZOLAM HCL (PF) 2 MG/2ML IJ SOLN
INTRAMUSCULAR | Status: DC | PRN
  Administered 2024-04-07: 2 mg via INTRAVENOUS

## 2024-04-07 MED ORDER — LIDOCAINE HCL (PF) 2 % IJ SOLN
INTRAMUSCULAR | Status: AC
  Filled 2024-04-07: qty 5

## 2024-04-07 MED ORDER — CHLORHEXIDINE GLUCONATE 0.12 % MT SOLN
OROMUCOSAL | Status: AC
Start: 2024-04-07 — End: 2024-04-07
  Filled 2024-04-07: qty 15

## 2024-04-07 MED ORDER — ONDANSETRON HCL 4 MG/2ML IJ SOLN: 4.0000 mg | Freq: Once | INTRAMUSCULAR | Status: DC | PRN

## 2024-04-07 MED ORDER — FENTANYL CITRATE (PF) 100 MCG/2ML IJ SOLN
INTRAMUSCULAR | Status: DC | PRN
  Administered 2024-04-07: 50 ug via INTRAVENOUS
  Administered 2024-04-07 (×2): 25 ug via INTRAVENOUS

## 2024-04-07 MED ORDER — ACETAMINOPHEN 500 MG PO TABS
1000.0000 mg | ORAL_TABLET | ORAL | Status: AC
  Administered 2024-04-07: 1000 mg via ORAL

## 2024-04-07 MED ORDER — SILVER NITRATE-POT NITRATE 75-25 % EX MISC
CUTANEOUS | Status: DC | PRN
  Administered 2024-04-07: 1 via TOPICAL

## 2024-04-07 MED ORDER — KETOROLAC TROMETHAMINE 30 MG/ML IJ SOLN
INTRAMUSCULAR | Status: DC | PRN
  Administered 2024-04-07: 30 mg via INTRAVENOUS

## 2024-04-07 MED ORDER — BACITRACIN ZINC 500 UNIT/GM EX OINT
TOPICAL_OINTMENT | CUTANEOUS | Status: AC
  Filled 2024-04-07: qty 28.35

## 2024-04-07 MED ORDER — 0.9 % SODIUM CHLORIDE (POUR BTL) OPTIME
TOPICAL | Status: DC | PRN
  Administered 2024-04-07: 500 mL

## 2024-04-07 MED ORDER — FENTANYL CITRATE (PF) 100 MCG/2ML IJ SOLN
INTRAMUSCULAR | Status: AC
  Filled 2024-04-07: qty 2

## 2024-04-07 MED ORDER — SODIUM CHLORIDE 0.9 % IR SOLN
Status: DC | PRN
  Administered 2024-04-07: 700 mL

## 2024-04-07 MED ORDER — MIDAZOLAM HCL 2 MG/2ML IJ SOLN
INTRAMUSCULAR | Status: AC
  Filled 2024-04-07: qty 2

## 2024-04-07 SURGICAL SUPPLY — 25 items
BAG URINE DRAIN 2000ML AR STRL (UROLOGICAL SUPPLIES) IMPLANT
CATH URTH 16FR FL 2W BLN LF (CATHETERS) IMPLANT
DEVICE MYOSURE LITE (MISCELLANEOUS) IMPLANT
DEVICE MYOSURE REACH (MISCELLANEOUS) IMPLANT
DRSG TELFA 3X8 NADH STRL (GAUZE/BANDAGES/DRESSINGS) IMPLANT
ELECTRODE REM PT RTRN 9FT ADLT (ELECTROSURGICAL) ×2 IMPLANT
GLOVE BIO SURGEON STRL SZ 6 (GLOVE) ×2 IMPLANT
GLOVE BIOGEL PI IND STRL 6 (GLOVE) ×2 IMPLANT
GOWN STRL REUS W/ TWL LRG LVL3 (GOWN DISPOSABLE) ×4 IMPLANT
KIT PROCED FLUENT PRO FLT212S (KITS) ×2 IMPLANT
KIT TURNOVER CYSTO (KITS) ×2 IMPLANT
MANIFOLD NEPTUNE II (INSTRUMENTS) ×2 IMPLANT
NS IRRIG 500ML POUR BTL (IV SOLUTION) IMPLANT
PACK DNC HYST (MISCELLANEOUS) ×2 IMPLANT
PAD OB MATERNITY 11 LF (PERSONAL CARE ITEMS) ×2 IMPLANT
PAD PREP OB/GYN DISP 24X41 (PERSONAL CARE ITEMS) ×2 IMPLANT
SCRUB CHG 4% DYNA-HEX 4OZ (MISCELLANEOUS) ×2 IMPLANT
SEAL ROD LENS SCOPE MYOSURE (ABLATOR) ×2 IMPLANT
SET CYSTO IRRIGATION (SET/KITS/TRAYS/PACK) IMPLANT
SOL .9 NS 3000ML IRR UROMATIC (IV SOLUTION) ×2 IMPLANT
SOL PREP POV-IOD 4OZ 10% (MISCELLANEOUS) IMPLANT
SURGILUBE 2OZ TUBE FLIPTOP (MISCELLANEOUS) ×2 IMPLANT
TRAP FLUID SMOKE EVACUATOR (MISCELLANEOUS) ×2 IMPLANT
TUBING CONNECTING 10 (TUBING) ×2 IMPLANT
WATER STERILE IRR 500ML POUR (IV SOLUTION) ×2 IMPLANT

## 2024-04-07 NOTE — Anesthesia Postprocedure Evaluation (Signed)
 Anesthesia Post Note  Patient: Gabrielle Gutierrez  Procedure(s) Performed: DILATATION AND CURETTAGE /HYSTEROSCOPY (Uterus) REMOVAL, LESION, VULVA (Vulva)  Patient location during evaluation: PACU Anesthesia Type: General Level of consciousness: awake and alert Pain management: pain level controlled Vital Signs Assessment: post-procedure vital signs reviewed and stable Respiratory status: spontaneous breathing, nonlabored ventilation, respiratory function stable and patient connected to nasal cannula oxygen Cardiovascular status: blood pressure returned to baseline and stable Postop Assessment: no apparent nausea or vomiting Anesthetic complications: no   There were no known notable events for this encounter.   Last Vitals:  Vitals:   04/07/24 0855 04/07/24 1301  BP: 124/88 (!) 152/86  Pulse: 69 81  Resp: 17 12  Temp: 36.7 C   SpO2: 99% 98%    Last Pain:  Vitals:   04/07/24 0855  TempSrc: Temporal  PainSc: 0-No pain                 Shau-Shau Melia

## 2024-04-07 NOTE — Op Note (Signed)
 OPERATIVE NOTE  DATE OF PROCEDURE: 04/07/2024  PRE-OPERATIVE DIAGNOSIS:  1) Thickened endometrium 2) Vulvar lesion  POST-OPERATIVE DIAGNOSIS:  1) Same  OPERATION:  Hysteroscopy D&C  SURGEON(S): Surgeons and Role:    * Leigh Sober, MD - Primary   ANESTHESIA: Choice  ESTIMATED BLOOD LOSS: Minimal  OPERATIVE FINDINGS: Anteverted uterus with normal-appearing endometrium; bilateral tubal ostia normal appearing.   SPECIMEN:  ID Type Source Tests Collected by Time Destination  1 : Endometrial Curettings Tissue PATH Gyn biopsy SURGICAL PATHOLOGY Leigh Sober, MD 04/07/2024 1219   2 : vulvar Lesion Tissue PATH Gyn biopsy SURGICAL PATHOLOGY Leigh Sober, MD 04/07/2024 1230     COMPLICATIONS: None  DRAINS: None  DISPOSITION: Stable to recovery room  DESCRIPTION OF PROCEDURE:      The patient was prepped and draped in the dorsal lithotomy position and placed under general anesthesia. Her bladder was emptied. The cervix was grasped with a sing;e-toothed tenaculum. Respecting the position and curvature of her cervix, it was dilated to accommodate the hysteroscope. Normal appearing endometrium, no polyps or cysts visualized. A systematic curettage was performed in all quadrants until a gritty texture was noted. The hysteroscope was replaced and the endometrium inspected. No abnormalities were seen. The tenaculum was removed from the cervix and hemostasis was noted. The vulvar lesion was cleansed with Betadine and grasped with Adson forceps, and removed with iris scissors. Direct pressure and silver nitrite briefly touched, wound bed hemostatic. The weighted speculum was removed and the patient went to recovery room in stable condition.   Sober Leigh, DO [facility] 04/07/2024 12:42 PM

## 2024-04-07 NOTE — Anesthesia Preprocedure Evaluation (Addendum)
 Anesthesia Evaluation  Patient identified by MRN, date of birth, ID band Patient awake    Reviewed: Allergy & Precautions, H&P , NPO status , Patient's Chart, lab work & pertinent test results  History of Anesthesia Complications (+) PONV and history of anesthetic complications  Airway Mallampati: II  TM Distance: >3 FB Neck ROM: Full    Dental no notable dental hx.    Pulmonary neg pulmonary ROS   Pulmonary exam normal breath sounds clear to auscultation       Cardiovascular negative cardio ROS Normal cardiovascular exam Rhythm:Regular     Neuro/Psych  PSYCHIATRIC DISORDERS  Depression    negative neurological ROS     GI/Hepatic negative GI ROS, Neg liver ROS,,,  Endo/Other  negative endocrine ROS    Renal/GU negative Renal ROS  negative genitourinary   Musculoskeletal negative musculoskeletal ROS (+)    Abdominal   Peds negative pediatric ROS (+)  Hematology negative hematology ROS (+)   Anesthesia Other Findings   Reproductive/Obstetrics negative OB ROS                              Anesthesia Physical Anesthesia Plan  ASA: 2  Anesthesia Plan: General   Post-op Pain Management:    Induction:   PONV Risk Score and Plan: Propofol  infusion  Airway Management Planned: Natural Airway  Additional Equipment:   Intra-op Plan:   Post-operative Plan:   Informed Consent: I have reviewed the patients History and Physical, chart, labs and discussed the procedure including the risks, benefits and alternatives for the proposed anesthesia with the patient or authorized representative who has indicated his/her understanding and acceptance.       Plan Discussed with: CRNA  Anesthesia Plan Comments: (IVGA with LMA as back up)         Anesthesia Quick Evaluation

## 2024-04-07 NOTE — H&P (Signed)
 GYNECOLOGY PREOPERATIVE HISTORY AND PHYSICAL   Subjective:  This encounter was facilitated by video Spanish language interpreter.  Gabrielle Gutierrez is a 48 y.o. [phone] here for surgical management of thickened endometrium.   Indications for procedure include: psychological distress, family history of uterine cancer. No significant preoperative concerns.  Proposed surgery: hysteroscopy with D&C    Pertinent Gynecological History: Menses: post-menopausal Bleeding: none -- no PMB Contraception: post menopausal status Last mammogram: normal Date: 07/26/22 Last pap: normal Date: 02/20/24   Past Medical History:  Diagnosis Date   BRCA negative 01/2024   MyRisk neg   Family history of breast cancer 01/2024   Increased risk of breast cancer 01/2024   IBIS=19.3%/riskscore=24.0%   Spina bifida (HCC)    Past Surgical History:  Procedure Laterality Date   APPENDECTOMY     CESAREAN SECTION     2   CHOLECYSTECTOMY     COLONOSCOPY WITH PROPOFOL  N/A 04/05/2020   Procedure: COLONOSCOPY WITH PROPOFOL ;  Surgeon: Unk Corinn Skiff, MD;  Location: [facility] ENDOSCOPY;  Service: Gastroenterology;  Laterality: N/A;   ESOPHAGOGASTRODUODENOSCOPY (EGD) WITH PROPOFOL  N/A 09/14/2020   Procedure: ESOPHAGOGASTRODUODENOSCOPY (EGD) WITH PROPOFOL ;  Surgeon: Unk Corinn Skiff, MD;  Location: [facility] ENDOSCOPY;  Service: Gastroenterology;  Laterality: N/A;   OB History  Gravida Para Term Preterm AB Living  2 2 2   2   SAB IAB Ectopic Multiple Live Births      2    # Outcome Date GA Lbr Len/2nd Weight Sex Type Anes PTL Lv  2 Term 09/11/06    F CS-Unspec   LIV  1 Term 12/14/98    M CS-Unspec   LIV  Patient denies any other pertinent gynecologic issues.  Family History  Problem Relation Age of Onset   Cancer Mother    Breast cancer Mother 30   Rheum arthritis Father    Vaginal cancer Paternal Grandmother    Social History   Socioeconomic History   Marital status: Married    Spouse name: Not  on file   Number of children: Not on file   Years of education: Not on file   Highest education level: Not on file  Occupational History   Not on file  Tobacco Use   Smoking status: Never   Smokeless tobacco: Never  Vaping Use   Vaping status: Never Used  Substance and Sexual Activity   Alcohol use: Yes    Comment: socially   Drug use: Never   Sexual activity: Yes    Birth control/protection: Pill  Other Topics Concern   Not on file  Social History Narrative   Not on file   Social Drivers of Health   Financial Resource Strain: Not on file  Food Insecurity: Not on file  Transportation Needs: Not on file  Physical Activity: Not on file  Stress: Not on file  Social Connections: Unknown (09/26/2021)   [facility]   Social Network    Social Network: Not on file  Intimate Partner Violence: Unknown (08/26/2021)   Received from [facility]   HITS    Physically Hurt: Not on file    Insult or Talk Down To: Not on file    Threaten Physical Harm: Not on file    Scream or Curse: Not on file   No current facility-administered medications on file prior to encounter.   Current Outpatient Medications on File Prior to Encounter  Medication Sig Dispense Refill   Levonorgestrel-Ethinyl Estradiol (AMETHIA) 0.15-0.03 &0.01 MG tablet Take  1 tablet by mouth at bedtime. (Patient not taking: No sig reported) 84 tablet 3   methocarbamol  (ROBAXIN ) 500 MG tablet Take 1 tablet (500 mg total) by mouth 2 (two) times daily as needed for muscle spasms. (Patient not taking: No sig reported) 10 tablet 0   venlafaxine XR (EFFEXOR XR) 37.5 MG 24 hr capsule Take 1 capsule (37.5 mg total) by mouth daily. (Patient not taking: Reported on 03/28/2024) 90 capsule 3   No Known Allergies    Review of Systems Constitutional: No recent fever/chills/sweats Respiratory: No recent cough/bronchitis Cardiovascular: No chest pain Gastrointestinal: No recent nausea/vomiting/diarrhea Genitourinary: No  UTI symptoms Hematologic/lymphatic:No history of coagulopathy or recent blood thinner use    Objective:   Blood pressure 124/88, pulse 69, temperature 98.1 F (36.7 C), temperature source Temporal, resp. rate 17, height 5' 9 (1.753 m), weight 92.5 kg, last menstrual period 09/19/2017, SpO2 99%. CONSTITUTIONAL: Well-developed, well-nourished female in no acute distress.  HENT:  Normocephalic, atraumatic, External right and left ear normal. Oropharynx is clear and moist EYES: Conjunctivae and EOM are normal. Pupils are equal, round, and reactive to light. No scleral icterus.  NECK: Normal range of motion, supple, no masses SKIN: Skin is warm and dry. No rash noted. Not diaphoretic. No erythema. No pallor. NEUROLOGIC: Alert and oriented to person, place, and time. Normal reflexes, muscle tone coordination. No cranial nerve deficit noted. PSYCHIATRIC: Normal mood and affect. Normal behavior. Normal judgment and thought content. CARDIOVASCULAR: Normal heart rate noted, regular rhythm RESPIRATORY: Effort and breath sounds normal, no problems with respiration noted ABDOMEN: Soft, nontender, nondistended. PELVIC: Deferred MUSCULOSKELETAL: Normal range of motion. No edema and no tenderness. 2+ distal pulses.   Labs: No results found for this or any previous visit (from the past 2 weeks).   Imaging Studies: CLINICAL DATA:  Initial evaluation for thickened endometrium on prior CT. Patient is postmenopausal per provided history.   EXAM: TRANSABDOMINAL AND TRANSVAGINAL ULTRASOUND OF PELVIS   TECHNIQUE: Both transabdominal and transvaginal ultrasound examinations of the pelvis were performed. Transabdominal technique was performed for global imaging of the pelvis including uterus, ovaries, adnexal regions, and pelvic cul-de-sac. It was necessary to proceed with endovaginal exam following the transabdominal exam to visualize the endometrium.   COMPARISON:  CT from 01/06/2024.    FINDINGS: Uterus   Measurements: 7.4 x 3.9 x 4.8 cm = volume: 72.0 mL. Uterus is anteverted. Heterogeneous echotexture within the uterine myometrium. 1.2 x 1.1 x 0.7 cm ovoid hypoechoic lesion at the left lower uterine segment, likely a small intramural fibroid.   Endometrium   Thickness: 10.5 mm.  No focal abnormality visualized.   Right ovary   Not visualized.  No adnexal mass.   Left ovary   Not visualized.  No adnexal mass.   Other findings   No abnormal free fluid.   IMPRESSION: 1. Endometrial stripe thickened up to 10.5 mm, considered abnormal for an asymptomatic post-menopausal female. Endometrial sampling should be considered to exclude carcinoma. 2. 1.2 cm intramural fibroid at the left lower uterine segment. 3. Nonvisualization of either ovary.  No adnexal mass or free fluid.  Assessment:     48 y.o. [id number] , postmenopausal for many years, without PMB, and negative EMB in office, but [facility] of uterine cancer and desiring the thickened endometrium to be thinned. Also mentions today that she has a wart-like lesion on her vulva that she would like removed.       Plan:    Counseling: Procedure, risks, reasons, benefits and complications (  including injury to bowel, bladder, major blood vessel, ureter, bleeding, possibility of transfusion, infection, or fistula formation) reviewed in detail. Likelihood of success in alleviating the patient's condition was discussed. Routine postoperative instructions will be reviewed with the patient and her family in detail after surgery.  The patient concurred with the proposed plan, giving informed written consent for the surgery.   -To OR when able -2-3wks post-op    Estil Mangle, DO [facility]

## 2024-04-07 NOTE — Transfer of Care (Signed)
 Immediate Anesthesia Transfer of Care Note  Patient: Gabrielle Gutierrez  Procedure(s) Performed: DILATATION AND CURETTAGE /HYSTEROSCOPY (Uterus) REMOVAL, LESION, VULVA (Vulva)  Patient Location: PACU  Anesthesia Type:General  Level of Consciousness: awake  Airway & Oxygen Therapy: Patient Spontanous Breathing  Post-op Assessment: Report given to RN and Post -op Vital signs reviewed and stable  Post vital signs: Reviewed and stable  Last Vitals:  Vitals Value Taken Time  BP 152/86 04/07/24 13:01  Temp    Pulse 69 04/07/24 13:02  Resp 10 04/07/24 13:03  SpO2 99 % 04/07/24 13:02  Vitals shown include unfiled device data.  Last Pain:  Vitals:   04/07/24 0855  TempSrc: Temporal  PainSc: 0-No pain         Complications: There were no known notable events for this encounter.

## 2024-04-08 ENCOUNTER — Encounter: Payer: Self-pay | Admitting: Obstetrics

## 2024-04-08 LAB — SURGICAL PATHOLOGY

## 2024-04-15 NOTE — Progress Notes (Signed)
    OBSTETRICS/GYNECOLOGY POST-OPERATIVE CLINIC VISIT  Subjective:    This encounter was facilitated by video Spanish interpreter.   Gabrielle Gutierrez is a 48 y.o. female who presents to the clinic 3 weeks status post hysteroscopy and D&C for thickened endometrium. Also had a vulvar lesion removed. Eating a regular diet without difficulty. Bowel movements are normal. The patient is not having any pain. She reports the OCPs she was previously taking are now making her tired and nauseous, she has stopped taking them. Prefers to keep all hormones out of her body to decrease her increased breast cancer risk. Is also still interested in prophylactic mastectomy.   The following portions of the patient's history were reviewed and updated as appropriate: allergies, current medications, past family history, past medical history, past social history, past surgical history, and problem list.  Review of Systems Pertinent items are noted in HPI.   Objective:   BP 107/70   Pulse 71   Wt 210 lb 14.4 oz (95.7 kg)   LMP 09/19/2017 (Approximate)   BMI 31.14 kg/m  Body mass index is 31.14 kg/m.  General:  alert and no distress  Abdomen: soft, bowel sounds active, non-tender  Incision:   N/A    Pathology:  FINAL DIAGNOSIS        1. Endometrium, curettage,  :       INACTIVE ENDOMETRIAL GLANDS.       BENIGN SQUAMOUS MUCOSA.       NEGATIVE FOR ENDOMETRIAL INTRAEPITHELIAL NEOPLASIA (EIN) AND MALIGNANCY.        2. Vulva, biopsy, lesion :       CONDYLOMA ACUMINATUM.   Assessment:   48 y.o. [id number] s/p HSC w/D&C and wart removal, doing well. PMH of increased breast cancer risk of 24% lifetime per [employer] genetic testing, but is BRCA negative. Self-Dc'ed her OCPs to stay off all hormones, declines alternatives; wants to lower her breast cancer risk as much as possible.   Plan:   1. Continue any current medications as instructed by provider. 2. Wound care discussed. 3. Operative findings again  reviewed. Pathology report discussed. Photos reviewed and provided to pt.  4. Activity restrictions: none 5. Anticipated return to work: not applicable. 6. General surgery referral to discuss ppx mastectomy 6. Follow up: prn; Sep 2026 for annual exam   Estil Mangle, DO [facility]

## 2024-04-28 ENCOUNTER — Inpatient Hospital Stay: Admission: RE | Admit: 2024-04-28 | Discharge: 2024-04-28 | Attending: Obstetrics

## 2024-04-28 DIAGNOSIS — Z1231 Encounter for screening mammogram for malignant neoplasm of breast: Secondary | ICD-10-CM

## 2024-04-28 DIAGNOSIS — Z9189 Other specified personal risk factors, not elsewhere classified: Secondary | ICD-10-CM

## 2024-04-29 ENCOUNTER — Encounter: Payer: Self-pay | Admitting: Obstetrics

## 2024-04-29 ENCOUNTER — Ambulatory Visit: Admitting: Obstetrics

## 2024-04-29 VITALS — BP 107/70 | HR 71 | Wt 210.9 lb

## 2024-04-29 DIAGNOSIS — Z4889 Encounter for other specified surgical aftercare: Secondary | ICD-10-CM

## 2024-04-29 DIAGNOSIS — Z9189 Other specified personal risk factors, not elsewhere classified: Secondary | ICD-10-CM
